# Patient Record
Sex: Male | Born: 1955 | Race: White | Hispanic: No | Marital: Single | Smoking: Former smoker
Health system: Southern US, Community
[De-identification: ages and names within clinical notes are randomized; demographics above are authoritative.]

## PROBLEM LIST (undated history)

## (undated) DIAGNOSIS — I10 Essential (primary) hypertension: Secondary | ICD-10-CM

## (undated) DIAGNOSIS — F32A Depression, unspecified: Secondary | ICD-10-CM

## (undated) DIAGNOSIS — F609 Personality disorder, unspecified: Secondary | ICD-10-CM

## (undated) DIAGNOSIS — F329 Major depressive disorder, single episode, unspecified: Secondary | ICD-10-CM

## (undated) DIAGNOSIS — W3400XA Accidental discharge from unspecified firearms or gun, initial encounter: Secondary | ICD-10-CM

## (undated) DIAGNOSIS — E669 Obesity, unspecified: Secondary | ICD-10-CM

## (undated) DIAGNOSIS — S0193XA Puncture wound without foreign body of unspecified part of head, initial encounter: Secondary | ICD-10-CM

## (undated) DIAGNOSIS — E7800 Pure hypercholesterolemia, unspecified: Secondary | ICD-10-CM

## (undated) DIAGNOSIS — R413 Other amnesia: Secondary | ICD-10-CM

## (undated) HISTORY — PX: OTHER SURGICAL HISTORY: SHX169

## (undated) HISTORY — DX: Depression, unspecified: F32.A

## (undated) HISTORY — DX: Puncture wound without foreign body of unspecified part of head, initial encounter: S01.93XA

## (undated) HISTORY — DX: Major depressive disorder, single episode, unspecified: F32.9

## (undated) HISTORY — PX: COLONOSCOPY W/ BIOPSIES AND POLYPECTOMY: SHX1376

## (undated) HISTORY — DX: Other amnesia: R41.3

## (undated) HISTORY — DX: Obesity, unspecified: E66.9

## (undated) HISTORY — DX: Personality disorder, unspecified: F60.9

## (undated) HISTORY — DX: Accidental discharge from unspecified firearms or gun, initial encounter: W34.00XA

## (undated) HISTORY — DX: Pure hypercholesterolemia, unspecified: E78.00

## (undated) HISTORY — DX: Essential (primary) hypertension: I10

## (undated) SURGERY — Surgical Case
Anesthesia: *Unknown

---

## 1998-04-13 ENCOUNTER — Ambulatory Visit (HOSPITAL_COMMUNITY): Admission: RE | Admit: 1998-04-13 | Discharge: 1998-04-13 | Payer: Self-pay | Admitting: *Deleted

## 2004-08-12 ENCOUNTER — Ambulatory Visit: Payer: Self-pay | Admitting: Internal Medicine

## 2004-10-29 ENCOUNTER — Ambulatory Visit: Payer: Self-pay | Admitting: Internal Medicine

## 2004-11-11 ENCOUNTER — Ambulatory Visit: Payer: Self-pay | Admitting: Internal Medicine

## 2004-12-27 ENCOUNTER — Ambulatory Visit: Payer: Self-pay | Admitting: Internal Medicine

## 2005-02-11 ENCOUNTER — Ambulatory Visit: Payer: Self-pay | Admitting: Internal Medicine

## 2005-03-18 ENCOUNTER — Ambulatory Visit: Payer: Self-pay | Admitting: Internal Medicine

## 2005-04-21 ENCOUNTER — Ambulatory Visit: Payer: Self-pay | Admitting: Internal Medicine

## 2005-07-22 ENCOUNTER — Ambulatory Visit: Payer: Self-pay | Admitting: Internal Medicine

## 2005-11-21 ENCOUNTER — Ambulatory Visit: Payer: Self-pay | Admitting: Internal Medicine

## 2006-02-20 ENCOUNTER — Ambulatory Visit: Payer: Self-pay | Admitting: Internal Medicine

## 2006-06-16 ENCOUNTER — Ambulatory Visit: Payer: Self-pay | Admitting: Internal Medicine

## 2006-09-15 ENCOUNTER — Ambulatory Visit: Payer: Self-pay | Admitting: Internal Medicine

## 2006-10-19 ENCOUNTER — Ambulatory Visit: Payer: Self-pay | Admitting: Internal Medicine

## 2006-12-15 ENCOUNTER — Ambulatory Visit: Payer: Self-pay | Admitting: Internal Medicine

## 2007-03-15 ENCOUNTER — Encounter: Payer: Self-pay | Admitting: Internal Medicine

## 2007-03-15 DIAGNOSIS — M545 Low back pain, unspecified: Secondary | ICD-10-CM | POA: Insufficient documentation

## 2007-03-15 DIAGNOSIS — R569 Unspecified convulsions: Secondary | ICD-10-CM | POA: Insufficient documentation

## 2007-03-15 DIAGNOSIS — I10 Essential (primary) hypertension: Secondary | ICD-10-CM | POA: Insufficient documentation

## 2007-03-15 DIAGNOSIS — E785 Hyperlipidemia, unspecified: Secondary | ICD-10-CM | POA: Insufficient documentation

## 2007-08-23 ENCOUNTER — Encounter: Payer: Self-pay | Admitting: Internal Medicine

## 2007-09-13 ENCOUNTER — Encounter: Admission: RE | Admit: 2007-09-13 | Discharge: 2007-09-13 | Payer: Self-pay | Admitting: Family Medicine

## 2008-02-28 ENCOUNTER — Emergency Department (HOSPITAL_COMMUNITY): Admission: EM | Admit: 2008-02-28 | Discharge: 2008-02-28 | Payer: Self-pay | Admitting: Emergency Medicine

## 2009-07-30 ENCOUNTER — Encounter: Admission: RE | Admit: 2009-07-30 | Discharge: 2009-07-30 | Payer: Self-pay | Admitting: Neurology

## 2010-09-29 ENCOUNTER — Encounter: Payer: Self-pay | Admitting: Neurology

## 2011-01-24 NOTE — Op Note (Signed)
[name], [name] NO.:  [id number]   MEDICAL RECORD NO.:  [id number]          PATIENT TYPE:  EMS   LOCATION:  MAJO                         FACILITY:  [facility]   PHYSICIAN:  Quita Skye. Hart Rochester, M.D.  DATE OF BIRTH:  11-10-1955   DATE OF PROCEDURE:  12/30/2007  DATE OF DISCHARGE:                               OPERATIVE REPORT   PREOPERATIVE DIAGNOSIS:  End-stage renal disease.   POSTOPERATIVE DIAGNOSIS:  End-stage renal disease.   OPERATION:  1. Bilateral ultrasound localization internal jugular veins.  2. Insertion of Diatek catheter via left internal jugular vein (28      cm).   SURGEON:  Quita Skye. Hart Rochester, MD.   FIRST ASSISTANT:  Nurse.   ANESTHESIA:  Local.   PROCEDURE:  The patient was taken to the operating room, placed in the  Trendelenburg position.  Both internal jugular veins were imaged using B-  mode ultrasound.  The right internal jugular vein was larger than the  left.  A pacemaker was in place on the right infraclavicular area.  On  the left side, internal jugular vein appeared normal in appearance.  After prepping and draping in routine sterile manner, one attempt was  made to enter the right internal jugular vein, and artery was entered.  Compression applied for 10 minutes with no hematoma.  We then went to  the left side, left internal jugular vein was entered using a  supraclavicular approach.  Guidewire passed into the right atrium under  fluoroscopic guidance.  After dilating the tract appropriately over an  Amplatz super stiff wire, a 28-cm Diatek catheter was passed through  peel-away sheath, positioned in the right atrium, tunneled peripherally,  and secured with nylon sutures.  Wound was closed with Vicryl in  subcuticular fashion.  Sterile dressing applied.  The patient taken to  the recovery room in satisfactory condition.      Quita Skye Hart Rochester, M.D.  Electronically Signed     JDL/MEDQ  D:  12/30/2007  T:  12/31/2007  Job:   [id number]

## 2011-06-05 LAB — URINALYSIS, ROUTINE W REFLEX MICROSCOPIC
Glucose, UA: NEGATIVE
Hgb urine dipstick: NEGATIVE
Ketones, ur: NEGATIVE
pH: 6

## 2011-06-05 LAB — DIFFERENTIAL
Basophils Absolute: 0.1
Lymphocytes Relative: 37
Lymphs Abs: 3.6
Monocytes Absolute: 0.8
Monocytes Relative: 8
Neutro Abs: 5

## 2011-06-05 LAB — COMPREHENSIVE METABOLIC PANEL
Albumin: 3.9
BUN: 12
Calcium: 9.9
Creatinine, Ser: 0.9
GFR calc Af Amer: >60
Total Bilirubin: 1.2
Total Protein: 6.9

## 2011-06-05 LAB — CBC
HCT: 49.6
MCHC: 34.5
MCV: 89.5
Platelets: 192
RDW: 12.9

## 2011-06-05 LAB — RAPID URINE DRUG SCREEN, HOSP PERFORMED
Amphetamines: NOT DETECTED
Benzodiazepines: NOT DETECTED
Opiates: NOT DETECTED
Tetrahydrocannabinol: NOT DETECTED

## 2011-07-03 ENCOUNTER — Other Ambulatory Visit: Payer: Self-pay | Admitting: Gastroenterology

## 2011-07-03 ENCOUNTER — Ambulatory Visit (HOSPITAL_COMMUNITY)
Admission: RE | Admit: 2011-07-03 | Discharge: 2011-07-03 | Disposition: A | Discharge status: Home / Self Care | Payer: Medicare Other | Source: Ambulatory Visit | Attending: Gastroenterology | Admitting: Gastroenterology

## 2011-07-03 DIAGNOSIS — F341 Dysthymic disorder: Secondary | ICD-10-CM | POA: Insufficient documentation

## 2011-07-03 DIAGNOSIS — D126 Benign neoplasm of colon, unspecified: Secondary | ICD-10-CM | POA: Insufficient documentation

## 2011-07-03 DIAGNOSIS — I10 Essential (primary) hypertension: Secondary | ICD-10-CM | POA: Insufficient documentation

## 2011-07-03 DIAGNOSIS — K648 Other hemorrhoids: Secondary | ICD-10-CM | POA: Insufficient documentation

## 2011-07-03 DIAGNOSIS — E785 Hyperlipidemia, unspecified: Secondary | ICD-10-CM | POA: Insufficient documentation

## 2011-07-03 DIAGNOSIS — G40909 Epilepsy, unspecified, not intractable, without status epilepticus: Secondary | ICD-10-CM | POA: Insufficient documentation

## 2011-07-03 DIAGNOSIS — Z79899 Other long term (current) drug therapy: Secondary | ICD-10-CM | POA: Insufficient documentation

## 2011-07-03 DIAGNOSIS — Z1211 Encounter for screening for malignant neoplasm of colon: Secondary | ICD-10-CM | POA: Insufficient documentation

## 2011-07-10 NOTE — Op Note (Signed)
  NAMEAMADOU, [name] [name] NO.:  [id number]  MEDICAL RECORD NO.:  [id number]  LOCATION:  WLEN                         FACILITY:  [facility]  PHYSICIAN:  Shirley Friar, MDDATE OF BIRTH:  18-May-1956  DATE OF PROCEDURE:  07/03/2011 DATE OF DISCHARGE:                              OPERATIVE REPORT   PROCEDURE:  Colonoscopy.  INDICATION:  Screening.  MEDICATIONS FOR ANESTHESIA: 1. Propofol 130 mg IV. 2. Fentanyl 100 mcg IV. 3. Versed 1 mg IV. 4. Narcan 0.08 mg IV.  FINDINGS:  Rectal exam was unremarkable.  PROCEDURE IN DETAIL: A pediatric colonoscope was inserted into a well-prepped colon and advanced to the cecum.  Ileocecal valve and appendiceal orifice were identified.  On careful withdrawal of the colonoscope, there was a 1 cm pedunculated polyp in the sigmoid colon that was removed with snare cautery.  Near this area was a 6-mm sessile polyp that was removed with snare cautery.  On further withdrawal of the colonoscope, no other mucosal abnormalities were seen.  Retroflexion revealed small internal hemorrhoids.  ASSESSMENT: 1. Colon polyps x2, status post removal by snare cautery x2. 2. Small internal hemorrhoids.  PLAN: 1. No aspirin products x14 days. 2. Follow up on path.     Shirley Friar, MD     VCS/MEDQ  D:  07/03/2011  T:  07/03/2011  Job:  [id number]  Electronically Signed by Charlott Rakes MD on 07/10/2011 10:48:20 AM

## 2012-12-30 ENCOUNTER — Telehealth: Payer: Self-pay | Admitting: *Deleted

## 2012-12-30 NOTE — Telephone Encounter (Signed)
Patient has been sched with group home([facility] [phone])

## 2013-04-07 ENCOUNTER — Telehealth: Payer: Self-pay | Admitting: Neurology

## 2013-05-24 ENCOUNTER — Encounter: Payer: Self-pay | Admitting: Neurology

## 2013-05-25 ENCOUNTER — Ambulatory Visit: Payer: Self-pay | Admitting: Neurology

## 2013-06-10 ENCOUNTER — Ambulatory Visit: Payer: Self-pay | Admitting: Neurology

## 2013-12-08 NOTE — Telephone Encounter (Signed)
Closing encounter

## 2014-01-17 ENCOUNTER — Ambulatory Visit: Payer: Medicare Other | Admitting: Diagnostic Neuroimaging

## 2014-01-23 ENCOUNTER — Ambulatory Visit (INDEPENDENT_AMBULATORY_CARE_PROVIDER_SITE_OTHER): Payer: Medicare Other | Admitting: Diagnostic Neuroimaging

## 2014-01-23 ENCOUNTER — Encounter (INDEPENDENT_AMBULATORY_CARE_PROVIDER_SITE_OTHER): Payer: Self-pay

## 2014-01-23 ENCOUNTER — Ambulatory Visit: Payer: Medicare Other | Admitting: Diagnostic Neuroimaging

## 2014-01-23 ENCOUNTER — Encounter: Payer: Self-pay | Admitting: Diagnostic Neuroimaging

## 2014-01-23 VITALS — BP 139/84 | HR 63 | Temp 98.0°F | Ht 66.0 in | Wt 197.0 lb

## 2014-01-23 DIAGNOSIS — S0190XA Unspecified open wound of unspecified part of head, initial encounter: Secondary | ICD-10-CM

## 2014-01-23 DIAGNOSIS — S069X9A Unspecified intracranial injury with loss of consciousness of unspecified duration, initial encounter: Principal | ICD-10-CM

## 2014-01-23 DIAGNOSIS — W3400XA Accidental discharge from unspecified firearms or gun, initial encounter: Secondary | ICD-10-CM

## 2014-01-23 DIAGNOSIS — T148XXA Other injury of unspecified body region, initial encounter: Secondary | ICD-10-CM
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
## 2014-01-23 NOTE — Patient Instructions (Signed)
Continue current medications. 

## 2014-11-16 ENCOUNTER — Encounter: Payer: Self-pay | Admitting: Diagnostic Neuroimaging

## 2014-11-16 ENCOUNTER — Ambulatory Visit (INDEPENDENT_AMBULATORY_CARE_PROVIDER_SITE_OTHER): Payer: Medicare Other | Admitting: Diagnostic Neuroimaging

## 2014-11-16 VITALS — BP 139/89 | HR 72 | Ht 68.0 in | Wt 206.4 lb

## 2014-11-16 DIAGNOSIS — S069X5S Unspecified intracranial injury with loss of consciousness greater than 24 hours with return to pre-existing conscious level, sequela: Secondary | ICD-10-CM | POA: Diagnosis not present

## 2014-11-16 DIAGNOSIS — G4720 Circadian rhythm sleep disorder, unspecified type: Secondary | ICD-10-CM | POA: Diagnosis not present

## 2014-11-16 DIAGNOSIS — S0190XS Unspecified open wound of unspecified part of head, sequela: Secondary | ICD-10-CM

## 2014-11-16 DIAGNOSIS — F518 Other sleep disorders not due to a substance or known physiological condition: Secondary | ICD-10-CM
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
## 2014-11-16 NOTE — Patient Instructions (Signed)
Give visual cues to patient / reminders about wake up time. Clock, sign etc.

## 2015-09-25 ENCOUNTER — Encounter (HOSPITAL_COMMUNITY): Payer: Self-pay | Admitting: *Deleted

## 2015-09-25 ENCOUNTER — Other Ambulatory Visit: Payer: Self-pay | Admitting: Gastroenterology

## 2015-09-25 NOTE — Addendum Note (Signed)
Addended by: Wilford Corner on: 09/25/2015 06:03 PM   Modules accepted: Orders

## 2015-09-25 NOTE — Progress Notes (Signed)
Pt currently resides in a group home and is under the care of Mrs. Janell Quiet. Mrs. Rico Junker completed pt SDW-pre-op call. Both Mrs. Rico Junker, caregiver and pt sister and guardian, Inocencio Homes were unable to complete pt anesthesia assessment; they both agreed that pt had a previous colonoscopy without any complications. Caregiver made aware to stop administering  Aspirin, otc vitamins, fish oil and herbal medications. Do not take any NSAIDs ie: Ibuprofen, Advil, Naproxen, Mobic, BC and Goody powder or any medication containing Aspirin. Caregiver denies that pt C/O any acute cardiopulmonary issues. Caregiver denies that pt is under the care of a cardiologist. Caregiver denies that pt had a stress test, echo and cardiac cath. Caregiver denies pt had a chest x ray or EKG within the last year. Mrs. Rico Junker verbalized understanding of all pre-op instructions, including bowel prep.

## 2015-09-26 ENCOUNTER — Ambulatory Visit (HOSPITAL_COMMUNITY)
Admission: RE | Admit: 2015-09-26 | Discharge: 2015-09-26 | Disposition: A | Discharge status: Home / Self Care | Payer: Medicare Other | Source: Ambulatory Visit | Attending: Gastroenterology | Admitting: Gastroenterology

## 2015-09-26 ENCOUNTER — Encounter (HOSPITAL_COMMUNITY)
Admission: RE | Disposition: A | Discharge status: Home / Self Care | Payer: Self-pay | Source: Ambulatory Visit | Attending: Gastroenterology

## 2015-09-26 ENCOUNTER — Ambulatory Visit (HOSPITAL_COMMUNITY): Payer: Medicare Other | Admitting: Certified Registered Nurse Anesthetist

## 2015-09-26 ENCOUNTER — Encounter (HOSPITAL_COMMUNITY): Payer: Self-pay | Admitting: Certified Registered Nurse Anesthetist

## 2015-09-26 DIAGNOSIS — D125 Benign neoplasm of sigmoid colon: Secondary | ICD-10-CM | POA: Diagnosis not present

## 2015-09-26 DIAGNOSIS — K648 Other hemorrhoids: Secondary | ICD-10-CM | POA: Diagnosis not present

## 2015-09-26 DIAGNOSIS — I10 Essential (primary) hypertension: Secondary | ICD-10-CM | POA: Insufficient documentation

## 2015-09-26 DIAGNOSIS — D123 Benign neoplasm of transverse colon: Secondary | ICD-10-CM | POA: Insufficient documentation

## 2015-09-26 DIAGNOSIS — K5730 Diverticulosis of large intestine without perforation or abscess without bleeding: Secondary | ICD-10-CM | POA: Insufficient documentation

## 2015-09-26 DIAGNOSIS — Z860100 Personal history of colon polyps, unspecified: Secondary | ICD-10-CM

## 2015-09-26 DIAGNOSIS — Z87891 Personal history of nicotine dependence: Secondary | ICD-10-CM | POA: Diagnosis not present

## 2015-09-26 HISTORY — PX: COLONOSCOPY WITH PROPOFOL: SHX5780

## 2015-09-26 SURGERY — COLONOSCOPY WITH PROPOFOL
Anesthesia: Monitor Anesthesia Care

## 2015-09-26 MED ORDER — SODIUM CHLORIDE 0.9 % IV SOLN: INTRAVENOUS | Status: DC

## 2015-09-26 MED ORDER — PROPOFOL 500 MG/50ML IV EMUL
INTRAVENOUS | Status: DC | PRN
  Administered 2015-09-26: 75 ug/kg/min via INTRAVENOUS

## 2015-09-26 MED ORDER — LACTATED RINGERS IV SOLN: INTRAVENOUS | Status: DC

## 2015-09-26 MED ORDER — LACTATED RINGERS IV SOLN
INTRAVENOUS | Status: DC | PRN
  Administered 2015-09-26: 09:00:00 via INTRAVENOUS

## 2015-09-26 MED ORDER — LIDOCAINE HCL (CARDIAC) 20 MG/ML IV SOLN
INTRAVENOUS | Status: DC | PRN
  Administered 2015-09-26: 80 mg via INTRAVENOUS

## 2015-09-26 NOTE — Op Note (Signed)
[facility] [place], [id number]   COLONOSCOPY PROCEDURE REPORT     EXAM DATE: 10/07/15  PATIENT NAME:      [name] [name]           MR #:      [id number]  BIRTHDATE:       09-22-1955      VISIT #:     [id number]  ATTENDING:     Wilford Corner, MD     STATUS:     outpatient REFERRING MD:      Lujean Amel, M.D. ASA CLASS:        Class II  INDICATIONS:  The patient is a 60 yr old male here for a colonoscopy due to high risk patient with personal history of colonic polyps.  PROCEDURE PERFORMED:     Colonoscopy with snare polypectomy MEDICATIONS:     Monitored anesthesia care and Per Anesthesia  ESTIMATED BLOOD LOSS:     None  CONSENT: The patient understands the risks and benefits of the procedure and understands that these risks include, but are not limited to: sedation, allergic reaction, infection, perforation and/or bleeding. Alternative means of evaluation and treatment include, among others: physical exam, x-rays, and/or surgical intervention. The patient elects to proceed with this endoscopic procedure.  DESCRIPTION OF PROCEDURE: During intra-op preparation period all mechanical & medical equipment was checked for proper function. Hand hygiene and appropriate measures for infection prevention was taken. After the risks, benefits and alternatives of the procedure were thoroughly explained, Informed consent was verified, confirmed and timeout was successfully executed by the treatment team. A digital exam revealed no abnormalities of the rectum.      The Pentax Ped Colon [id number] endoscope was introduced through the anus and advanced to the cecum, which was identified by both the appendix and ileocecal valve. The prep was good.. The instrument was then slowly withdrawn as the colon was fully examined. Estimated blood loss is zero unless otherwise noted in this procedure report. Loop reduction and abdominal pressure  was necessary to reach the cecum. A 8 mm sessile ascending colon polyp was removed with snare cautery. A 6 mm semi-pedunculated polyp was removed from the sigmoid colon with snare cautery. A 3 mm semi-sessile polyp was removed from the sigmoid colon with cold biopsy. A few small sigmoid diverticuli noted.      Retroflexed views revealed small internal hemorrhoids.  The scope was then completely withdrawn from the patient and the procedure terminated.     ADVERSE EVENTS:      There were no immediate complications.   IMPRESSIONS:     Colon polyps X 3 - see above Sigmoid diverticulosis Internal hemorrhoids  RECOMMENDATIONS:     F/U on path; Avoid aspirin products for 2 weeks; High fiber diet   Wilford Corner, MD eSigned:  Wilford Corner, MD 07-Oct-2015 10:02 AM   cc:  CPT CODES: ICD CODES:  The ICD and CPT codes recommended by this software are interpretations from the data that the clinical staff has captured with the software.  The verification of the translation of this report to the ICD and CPT codes and modifiers is the sole responsibility of the health care institution and practicing physician where this report was generated.  [employer] will not be held responsible for the validity of the ICD and CPT codes included on this report.  AMA assumes no liability for data contained or not contained herein. CPT is a registered  trademark of the [employer].  PATIENT NAME:  [name], [name] MR#: [id number]

## 2015-09-26 NOTE — Interval H&P Note (Signed)
History and Physical Interval Note:  09/26/2015 9:00 AM  [name] [name]  has presented today for surgery, with the diagnosis of history colon polyps  The various methods of treatment have been discussed with the patient and family. After consideration of risks, benefits and other options for treatment, the patient has consented to  Procedure(s): COLONOSCOPY WITH PROPOFOL (N/A) as a surgical intervention .  The patient's history has been reviewed, patient examined, no change in status, stable for surgery.  I have reviewed the patient's chart and labs.  Questions were answered to the patient's satisfaction.     [place]

## 2015-09-26 NOTE — H&P (Signed)
  Date of Initial H&P: 09/20/15  History reviewed, patient examined, no change in status, stable for surgery.

## 2015-09-26 NOTE — Anesthesia Postprocedure Evaluation (Signed)
Anesthesia Post Note  Patient: [name] [name]  Procedure(s) Performed: Procedure(s) (LRB): COLONOSCOPY WITH PROPOFOL (N/A)  Patient location during evaluation: Endoscopy Anesthesia Type: MAC Level of consciousness: awake and awake and alert Pain management: pain level controlled Vital Signs Assessment: post-procedure vital signs reviewed and stable Respiratory status: spontaneous breathing and nonlabored ventilation Anesthetic complications: no    Last Vitals:  Filed Vitals:   09/26/15 1025 09/26/15 1030  BP: 114/72   Pulse: 50 51  Temp:    Resp: 13 11    Last Pain: There were no vitals filed for this visit.               Vici Novick COKER

## 2015-09-26 NOTE — Progress Notes (Signed)
Chest rash looks better. Becoming less red. Rash does not appear anywhere else.

## 2015-09-26 NOTE — Transfer of Care (Signed)
Immediate Anesthesia Transfer of Care Note  Patient: [name] [name]  Procedure(s) Performed: Procedure(s): COLONOSCOPY WITH PROPOFOL (N/A)  Patient Location: Endoscopy Unit  Anesthesia Type:MAC  Level of Consciousness: awake, alert  and oriented  Airway & Oxygen Therapy: Patient Spontanous Breathing and Patient connected to nasal cannula oxygen  Post-op Assessment: Report given to RN and Post -op Vital signs reviewed and stable  Post vital signs: Reviewed and stable  Last Vitals:  Filed Vitals:   09/26/15 0824  BP: 135/76  Pulse: 54  Temp: 36.6 C  Resp: 13    Complications: No apparent anesthesia complications

## 2015-09-26 NOTE — Discharge Instructions (Signed)

## 2015-09-26 NOTE — Anesthesia Preprocedure Evaluation (Addendum)
Anesthesia Evaluation  Patient identified by MRN, date of birth, ID band Patient awake    Reviewed: Allergy & Precautions, NPO status , Patient's Chart, lab work & pertinent test results, Unable to perform ROS - Chart review only  Airway Mallampati: II  TM Distance: >3 FB Neck ROM: Full    Dental  (+) Dental Advisory Given, Teeth Intact   Pulmonary former smoker,    breath sounds clear to auscultation       Cardiovascular hypertension,  Rhythm:Regular Rate:Normal     Neuro/Psych    GI/Hepatic   Endo/Other    Renal/GU      Musculoskeletal   Abdominal   Peds  Hematology   Anesthesia Other Findings   Reproductive/Obstetrics                           Anesthesia Physical Anesthesia Plan  ASA: II  Anesthesia Plan: MAC   Post-op Pain Management:    Induction: Intravenous  Airway Management Planned: Natural Airway and Simple Face Mask  Additional Equipment:   Intra-op Plan:   Post-operative Plan:   Informed Consent: I have reviewed the patients History and Physical, chart, labs and discussed the procedure including the risks, benefits and alternatives for the proposed anesthesia with the patient or authorized representative who has indicated his/her understanding and acceptance.     Plan Discussed with: CRNA and Anesthesiologist  Anesthesia Plan Comments:         Anesthesia Quick Evaluation

## 2015-09-26 NOTE — Progress Notes (Signed)
Patient with reddened rash on chest area. Not seen on back legs arms or face. Patient denies symptoms or itching. Dr. Linna Caprice made aware and in to see patient. No new orders at this time. Will continue to monitor.

## 2015-09-27 ENCOUNTER — Encounter (HOSPITAL_COMMUNITY): Payer: Self-pay | Admitting: Gastroenterology

## 2015-11-15 DIAGNOSIS — Z79899 Other long term (current) drug therapy: Secondary | ICD-10-CM | POA: Diagnosis not present

## 2015-12-19 DIAGNOSIS — Z79899 Other long term (current) drug therapy: Secondary | ICD-10-CM | POA: Diagnosis not present

## 2015-12-19 DIAGNOSIS — J309 Allergic rhinitis, unspecified: Secondary | ICD-10-CM | POA: Diagnosis not present

## 2015-12-19 DIAGNOSIS — M545 Low back pain: Secondary | ICD-10-CM | POA: Diagnosis not present

## 2015-12-19 DIAGNOSIS — E782 Mixed hyperlipidemia: Secondary | ICD-10-CM | POA: Diagnosis not present

## 2015-12-19 DIAGNOSIS — G8929 Other chronic pain: Secondary | ICD-10-CM | POA: Diagnosis not present

## 2015-12-19 DIAGNOSIS — I10 Essential (primary) hypertension: Secondary | ICD-10-CM | POA: Diagnosis not present

## 2016-02-06 DIAGNOSIS — Z79899 Other long term (current) drug therapy: Secondary | ICD-10-CM | POA: Diagnosis not present

## 2016-02-18 DIAGNOSIS — Z111 Encounter for screening for respiratory tuberculosis: Secondary | ICD-10-CM | POA: Diagnosis not present

## 2016-02-25 DIAGNOSIS — J309 Allergic rhinitis, unspecified: Secondary | ICD-10-CM | POA: Diagnosis not present

## 2016-02-25 DIAGNOSIS — M25561 Pain in right knee: Secondary | ICD-10-CM | POA: Diagnosis not present

## 2016-02-26 ENCOUNTER — Other Ambulatory Visit: Payer: Self-pay | Admitting: Family Medicine

## 2016-02-26 ENCOUNTER — Ambulatory Visit
Admission: RE | Admit: 2016-02-26 | Discharge: 2016-02-26 | Disposition: A | Discharge status: Home / Self Care | Payer: Medicare Other | Source: Ambulatory Visit | Attending: Family Medicine | Admitting: Family Medicine

## 2016-02-26 DIAGNOSIS — M25561 Pain in right knee: Secondary | ICD-10-CM

## 2016-03-10 DIAGNOSIS — H04123 Dry eye syndrome of bilateral lacrimal glands: Secondary | ICD-10-CM | POA: Diagnosis not present

## 2016-03-10 DIAGNOSIS — H2513 Age-related nuclear cataract, bilateral: Secondary | ICD-10-CM | POA: Diagnosis not present

## 2016-03-18 DIAGNOSIS — G8929 Other chronic pain: Secondary | ICD-10-CM | POA: Diagnosis not present

## 2016-03-18 DIAGNOSIS — M25561 Pain in right knee: Secondary | ICD-10-CM | POA: Diagnosis not present

## 2016-03-18 DIAGNOSIS — M2241 Chondromalacia patellae, right knee: Secondary | ICD-10-CM | POA: Diagnosis not present

## 2016-04-28 DIAGNOSIS — Z79899 Other long term (current) drug therapy: Secondary | ICD-10-CM | POA: Diagnosis not present

## 2016-05-22 DIAGNOSIS — Z23 Encounter for immunization: Secondary | ICD-10-CM | POA: Diagnosis not present

## 2016-06-03 DIAGNOSIS — F3489 Other specified persistent mood disorders: Secondary | ICD-10-CM | POA: Diagnosis not present

## 2016-06-03 DIAGNOSIS — F6381 Intermittent explosive disorder: Secondary | ICD-10-CM | POA: Diagnosis not present

## 2016-06-03 DIAGNOSIS — F319 Bipolar disorder, unspecified: Secondary | ICD-10-CM | POA: Diagnosis not present

## 2016-06-24 DIAGNOSIS — I10 Essential (primary) hypertension: Secondary | ICD-10-CM | POA: Diagnosis not present

## 2016-06-24 DIAGNOSIS — J309 Allergic rhinitis, unspecified: Secondary | ICD-10-CM | POA: Diagnosis not present

## 2016-06-24 DIAGNOSIS — R7309 Other abnormal glucose: Secondary | ICD-10-CM | POA: Diagnosis not present

## 2016-06-24 DIAGNOSIS — Z0001 Encounter for general adult medical examination with abnormal findings: Secondary | ICD-10-CM | POA: Diagnosis not present

## 2016-06-24 DIAGNOSIS — F341 Dysthymic disorder: Secondary | ICD-10-CM | POA: Diagnosis not present

## 2016-06-24 DIAGNOSIS — Z125 Encounter for screening for malignant neoplasm of prostate: Secondary | ICD-10-CM | POA: Diagnosis not present

## 2016-06-24 DIAGNOSIS — Z79899 Other long term (current) drug therapy: Secondary | ICD-10-CM | POA: Diagnosis not present

## 2016-06-24 DIAGNOSIS — E782 Mixed hyperlipidemia: Secondary | ICD-10-CM | POA: Diagnosis not present

## 2016-06-24 DIAGNOSIS — R7301 Impaired fasting glucose: Secondary | ICD-10-CM | POA: Diagnosis not present

## 2016-07-09 DIAGNOSIS — N3941 Urge incontinence: Secondary | ICD-10-CM | POA: Diagnosis not present

## 2016-07-09 DIAGNOSIS — N3944 Nocturnal enuresis: Secondary | ICD-10-CM | POA: Diagnosis not present

## 2016-07-09 DIAGNOSIS — R350 Frequency of micturition: Secondary | ICD-10-CM | POA: Diagnosis not present

## 2016-08-13 DIAGNOSIS — N3941 Urge incontinence: Secondary | ICD-10-CM | POA: Diagnosis not present

## 2016-08-26 DIAGNOSIS — F6381 Intermittent explosive disorder: Secondary | ICD-10-CM | POA: Diagnosis not present

## 2016-08-26 DIAGNOSIS — F3489 Other specified persistent mood disorders: Secondary | ICD-10-CM | POA: Diagnosis not present

## 2016-08-26 DIAGNOSIS — F319 Bipolar disorder, unspecified: Secondary | ICD-10-CM | POA: Diagnosis not present

## 2016-10-01 DIAGNOSIS — F6381 Intermittent explosive disorder: Secondary | ICD-10-CM | POA: Diagnosis not present

## 2016-10-01 DIAGNOSIS — F319 Bipolar disorder, unspecified: Secondary | ICD-10-CM | POA: Diagnosis not present

## 2016-10-01 DIAGNOSIS — F3489 Other specified persistent mood disorders: Secondary | ICD-10-CM | POA: Diagnosis not present

## 2016-10-20 DIAGNOSIS — N3941 Urge incontinence: Secondary | ICD-10-CM | POA: Diagnosis not present

## 2016-10-20 DIAGNOSIS — N401 Enlarged prostate with lower urinary tract symptoms: Secondary | ICD-10-CM | POA: Diagnosis not present

## 2016-12-02 DIAGNOSIS — F6381 Intermittent explosive disorder: Secondary | ICD-10-CM | POA: Diagnosis not present

## 2016-12-02 DIAGNOSIS — F319 Bipolar disorder, unspecified: Secondary | ICD-10-CM | POA: Diagnosis not present

## 2016-12-02 DIAGNOSIS — F3489 Other specified persistent mood disorders: Secondary | ICD-10-CM | POA: Diagnosis not present

## 2016-12-10 DIAGNOSIS — Z79899 Other long term (current) drug therapy: Secondary | ICD-10-CM | POA: Diagnosis not present

## 2016-12-25 DIAGNOSIS — I10 Essential (primary) hypertension: Secondary | ICD-10-CM | POA: Diagnosis not present

## 2016-12-25 DIAGNOSIS — E782 Mixed hyperlipidemia: Secondary | ICD-10-CM | POA: Diagnosis not present

## 2016-12-25 DIAGNOSIS — F17210 Nicotine dependence, cigarettes, uncomplicated: Secondary | ICD-10-CM | POA: Diagnosis not present

## 2016-12-25 DIAGNOSIS — F341 Dysthymic disorder: Secondary | ICD-10-CM | POA: Diagnosis not present

## 2016-12-30 DIAGNOSIS — F319 Bipolar disorder, unspecified: Secondary | ICD-10-CM | POA: Diagnosis not present

## 2016-12-30 DIAGNOSIS — F6381 Intermittent explosive disorder: Secondary | ICD-10-CM | POA: Diagnosis not present

## 2017-01-07 ENCOUNTER — Other Ambulatory Visit: Payer: Self-pay | Admitting: Acute Care

## 2017-01-07 DIAGNOSIS — F17210 Nicotine dependence, cigarettes, uncomplicated: Secondary | ICD-10-CM

## 2017-01-09 DIAGNOSIS — I10 Essential (primary) hypertension: Secondary | ICD-10-CM | POA: Diagnosis not present

## 2017-01-19 ENCOUNTER — Encounter: Payer: Self-pay | Admitting: Acute Care

## 2017-01-19 ENCOUNTER — Ambulatory Visit (INDEPENDENT_AMBULATORY_CARE_PROVIDER_SITE_OTHER)
Admission: RE | Admit: 2017-01-19 | Discharge: 2017-01-19 | Disposition: A | Discharge status: Home / Self Care | Payer: Medicare Other | Source: Ambulatory Visit | Attending: Acute Care | Admitting: Acute Care

## 2017-01-19 ENCOUNTER — Ambulatory Visit (INDEPENDENT_AMBULATORY_CARE_PROVIDER_SITE_OTHER): Payer: Medicare Other | Admitting: Acute Care

## 2017-01-19 DIAGNOSIS — Z79899 Other long term (current) drug therapy: Secondary | ICD-10-CM | POA: Diagnosis not present

## 2017-01-19 DIAGNOSIS — F17210 Nicotine dependence, cigarettes, uncomplicated: Secondary | ICD-10-CM

## 2017-01-19 DIAGNOSIS — Z87891 Personal history of nicotine dependence: Secondary | ICD-10-CM

## 2017-01-19 DIAGNOSIS — L309 Dermatitis, unspecified: Secondary | ICD-10-CM | POA: Diagnosis not present

## 2017-01-19 DIAGNOSIS — Z122 Encounter for screening for malignant neoplasm of respiratory organs: Secondary | ICD-10-CM | POA: Diagnosis not present

## 2017-01-19 DIAGNOSIS — I10 Essential (primary) hypertension: Secondary | ICD-10-CM | POA: Diagnosis not present

## 2017-01-19 NOTE — Progress Notes (Signed)
Shared Decision Making Visit Lung Cancer Screening Program [phone])   Eligibility:  Age 61 y.o.  Pack Years Smoking History Calculation 46-pack-year smoking history (# packs/per year x # years smoked)  Recent History of coughing up blood  no  Unexplained weight loss? no ( >Than 15 pounds within the last 6 months )  Prior History Lung / other cancer no (Diagnosis within the last 5 years already requiring surveillance chest CT Scans).  Smoking Status Current Smoker  Former Smokers: Years since quit: Not applicable  Quit Date: Not applicable  Visit Components:  Discussion included one or more decision making aids. yes  Discussion included risk/benefits of screening. yes  Discussion included potential follow up diagnostic testing for abnormal scans. yes  Discussion included meaning and risk of over diagnosis. yes  Discussion included meaning and risk of False Positives. yes  Discussion included meaning of total radiation exposure. yes  Counseling Included:  Importance of adherence to annual lung cancer LDCT screening. yes  Impact of comorbidities on ability to participate in the program. yes  Ability and willingness to under diagnostic treatment. yes  Smoking Cessation Counseling:  Current Smokers:   Discussed importance of smoking cessation. yes  Information about tobacco cessation classes and interventions provided to patient. yes  Patient provided with "ticket" for LDCT Scan. yes  Symptomatic Patient. no  Counseling  Diagnosis Code: Tobacco Use Z72.0  Asymptomatic Patient yes  Counseling (Intermediate counseling: > three minutes counseling) Y1017  Former Smokers:   Discussed the importance of maintaining cigarette abstinence. yes  Diagnosis Code: Personal History of Nicotine Dependence. P10.258  Information about tobacco cessation classes and interventions provided to patient. Yes  Patient provided with "ticket" for LDCT Scan. yes  Written Order  for Lung Cancer Screening with LDCT placed in Epic. Yes (CT Chest Lung Cancer Screening Low Dose W/O CM) [id number] Z12.2-Screening of respiratory organs Z87.891-Personal history of nicotine dependence  I have spent 25 minutes of face to face time with Mr. Shirah and Rivka Barbara discussing the risks and benefits of lung cancer screening. We viewed a power point together that explained in detail the above noted topics. We paused at intervals to allow for questions to be asked and answered to ensure understanding.We discussed that the single most powerful action that he can take to decrease his risk of developing lung cancer is to quit smoking. We discussed whether or not he is ready to commit to setting a quit date. He is currently not ready to set a quit date. We discussed options for tools to aid in quitting smoking including nicotine replacement therapy, non-nicotine medications, support groups, Quit Smart classes, and behavior modification. We discussed that often times setting smaller, more achievable goals, such as eliminating 1 cigarette a day for a week and then 2 cigarettes a day for a week can be helpful in slowly decreasing the number of cigarettes smoked. This allows for a sense of accomplishment as well as providing a clinical benefit. I gave Mr. Richman the " Be Stronger Than Your Excuses" card with contact information for community resources, classes, free nicotine replacement therapy, and access to mobile apps, text messaging, and on-line smoking cessation help. I have also given him my card and contact information in the event he needs to contact me. We discussed the time and location of the scan, and that either Doroteo Glassman RN or I will call with the results within 24-48 hours of receiving them. I have offered Mr. Majano  a copy of the  power point we viewed  as a resource in the event they need reinforcement of the concepts we discussed today in the office. The patient verbalized understanding  of all of  the above and had no further questions upon leaving the office. They have my contact information in the event they have any further questions.  I spent 4 minutes counseling on smoking cessation and the health risks of continued tobacco abuse.  I explained to the patient that there has been a high incidence of coronary artery disease noted on these exams. I explained that this is a non-gated exam therefore degree or severity cannot be determined. This patient is currently on statin therapy. I have asked the patient to follow-up with their PCP regarding any incidental finding of coronary artery disease and management with diet or medication as their PCP  feels is clinically indicated. The patient verbalized understanding of the above and had no further questions upon completion of the visit.      Magdalen Spatz, NP

## 2017-01-26 ENCOUNTER — Other Ambulatory Visit: Payer: Self-pay | Admitting: Acute Care

## 2017-01-26 DIAGNOSIS — F17210 Nicotine dependence, cigarettes, uncomplicated: Secondary | ICD-10-CM

## 2017-02-17 DIAGNOSIS — R350 Frequency of micturition: Secondary | ICD-10-CM | POA: Diagnosis not present

## 2017-02-17 DIAGNOSIS — N401 Enlarged prostate with lower urinary tract symptoms: Secondary | ICD-10-CM | POA: Diagnosis not present

## 2017-02-17 DIAGNOSIS — N3944 Nocturnal enuresis: Secondary | ICD-10-CM | POA: Diagnosis not present

## 2017-03-18 DIAGNOSIS — F331 Major depressive disorder, recurrent, moderate: Secondary | ICD-10-CM | POA: Diagnosis not present

## 2017-03-30 DIAGNOSIS — L300 Nummular dermatitis: Secondary | ICD-10-CM | POA: Diagnosis not present

## 2017-03-31 DIAGNOSIS — Z111 Encounter for screening for respiratory tuberculosis: Secondary | ICD-10-CM | POA: Diagnosis not present

## 2017-04-01 DIAGNOSIS — F6381 Intermittent explosive disorder: Secondary | ICD-10-CM | POA: Diagnosis not present

## 2017-04-01 DIAGNOSIS — F319 Bipolar disorder, unspecified: Secondary | ICD-10-CM | POA: Diagnosis not present

## 2017-04-08 DIAGNOSIS — N401 Enlarged prostate with lower urinary tract symptoms: Secondary | ICD-10-CM | POA: Diagnosis not present

## 2017-04-08 DIAGNOSIS — N3944 Nocturnal enuresis: Secondary | ICD-10-CM | POA: Diagnosis not present

## 2017-04-08 DIAGNOSIS — N281 Cyst of kidney, acquired: Secondary | ICD-10-CM | POA: Diagnosis not present

## 2017-04-09 DIAGNOSIS — H2513 Age-related nuclear cataract, bilateral: Secondary | ICD-10-CM | POA: Diagnosis not present

## 2017-04-09 DIAGNOSIS — H04123 Dry eye syndrome of bilateral lacrimal glands: Secondary | ICD-10-CM | POA: Diagnosis not present

## 2017-04-14 DIAGNOSIS — F331 Major depressive disorder, recurrent, moderate: Secondary | ICD-10-CM | POA: Diagnosis not present

## 2017-05-12 DIAGNOSIS — F6381 Intermittent explosive disorder: Secondary | ICD-10-CM | POA: Diagnosis not present

## 2017-05-12 DIAGNOSIS — F319 Bipolar disorder, unspecified: Secondary | ICD-10-CM | POA: Diagnosis not present

## 2017-05-13 DIAGNOSIS — F331 Major depressive disorder, recurrent, moderate: Secondary | ICD-10-CM | POA: Diagnosis not present

## 2017-05-20 DIAGNOSIS — F331 Major depressive disorder, recurrent, moderate: Secondary | ICD-10-CM | POA: Diagnosis not present

## 2017-05-26 DIAGNOSIS — L300 Nummular dermatitis: Secondary | ICD-10-CM | POA: Diagnosis not present

## 2017-05-27 DIAGNOSIS — F331 Major depressive disorder, recurrent, moderate: Secondary | ICD-10-CM | POA: Diagnosis not present

## 2017-06-05 DIAGNOSIS — F331 Major depressive disorder, recurrent, moderate: Secondary | ICD-10-CM | POA: Diagnosis not present

## 2017-06-09 DIAGNOSIS — J0100 Acute maxillary sinusitis, unspecified: Secondary | ICD-10-CM | POA: Diagnosis not present

## 2017-06-17 DIAGNOSIS — F331 Major depressive disorder, recurrent, moderate: Secondary | ICD-10-CM | POA: Diagnosis not present

## 2017-06-17 DIAGNOSIS — F6381 Intermittent explosive disorder: Secondary | ICD-10-CM | POA: Diagnosis not present

## 2017-06-17 DIAGNOSIS — F319 Bipolar disorder, unspecified: Secondary | ICD-10-CM | POA: Diagnosis not present

## 2017-07-10 DIAGNOSIS — E782 Mixed hyperlipidemia: Secondary | ICD-10-CM | POA: Diagnosis not present

## 2017-07-10 DIAGNOSIS — F341 Dysthymic disorder: Secondary | ICD-10-CM | POA: Diagnosis not present

## 2017-07-10 DIAGNOSIS — Z23 Encounter for immunization: Secondary | ICD-10-CM | POA: Diagnosis not present

## 2017-07-10 DIAGNOSIS — Z0001 Encounter for general adult medical examination with abnormal findings: Secondary | ICD-10-CM | POA: Diagnosis not present

## 2017-08-04 DIAGNOSIS — F331 Major depressive disorder, recurrent, moderate: Secondary | ICD-10-CM | POA: Diagnosis not present

## 2017-08-05 DIAGNOSIS — L300 Nummular dermatitis: Secondary | ICD-10-CM | POA: Diagnosis not present

## 2017-08-13 ENCOUNTER — Encounter: Payer: Self-pay | Admitting: Podiatry

## 2017-08-13 ENCOUNTER — Ambulatory Visit (INDEPENDENT_AMBULATORY_CARE_PROVIDER_SITE_OTHER): Payer: Medicare Other | Admitting: Podiatry

## 2017-08-13 ENCOUNTER — Ambulatory Visit (INDEPENDENT_AMBULATORY_CARE_PROVIDER_SITE_OTHER): Payer: Medicare Other

## 2017-08-13 ENCOUNTER — Other Ambulatory Visit: Payer: Self-pay | Admitting: Podiatry

## 2017-08-13 VITALS — BP 139/83 | HR 58 | Resp 16

## 2017-08-13 DIAGNOSIS — M722 Plantar fascial fibromatosis: Secondary | ICD-10-CM

## 2017-08-13 DIAGNOSIS — M25572 Pain in left ankle and joints of left foot: Secondary | ICD-10-CM | POA: Diagnosis not present

## 2017-08-13 DIAGNOSIS — M779 Enthesopathy, unspecified: Secondary | ICD-10-CM

## 2017-08-13 DIAGNOSIS — M25571 Pain in right ankle and joints of right foot: Secondary | ICD-10-CM

## 2017-08-13 MED ORDER — TRIAMCINOLONE ACETONIDE 10 MG/ML IJ SUSP
10.0000 mg | Freq: Once | INTRAMUSCULAR | Status: AC
  Administered 2017-08-13: 10 mg

## 2017-08-13 NOTE — Progress Notes (Signed)
Subjective:   Patient ID: [name] [name], male   DOB: 61 y.o.   MRN: [id number]   HPI Patient presents with caregiver with pain around the left arch that is inflamed with fluid buildup and on the outside of the right foot.  Patient has been active on his feet and has had to wear a slip resistant shoe which is not typical and patient does smoke not at the current time but did smoke for a number years and likes to be active.   Review of Systems  All other systems reviewed and are negative.       Objective:  Physical Exam  Constitutional: He appears well-developed and well-nourished.  Cardiovascular: Intact distal pulses.  Pulmonary/Chest: Effort normal.  Musculoskeletal: Normal range of motion.  Neurological: He is alert.  Skin: Skin is warm.  Nursing note and vitals reviewed.   Neurovascular status intact muscle strength adequate range of motion within normal limits with patient found to have mid arch exquisite discomfort left and pain in the peroneal tendon insertion base of fifth metatarsal right which may be compensatory in nature.  Patient has good digital perfusion and is well oriented x3.     Assessment:  Plantar fasciitis left mid arch and peroneal tendinitis right with inflammation fluid buildup.     Plan:  H&P x-rays reviewed with patient and careful mid arch injection administered left 3 mg Kenalog 5 mg Xylocaine and peroneal insertion injection right 3 mg Kenalog 5 mg Xylocaine along with fascial brace application bilateral.  Discussed long-term orthotics which he wants and we will make them at next visit when I see that his symptoms have improved.  Patient will be seen back and also will utilize low-grade physical therapy.  X-rays did indicate moderate depression of the arch with small spur formation and no indications of stress fracture or advanced arthritis

## 2017-08-13 NOTE — Progress Notes (Signed)
   Subjective:    Patient ID: [name] [name], male    DOB: [birth year], 61 y.o.   MRN: [id number]  HPI    Review of Systems  All other systems reviewed and are negative.      Objective:   Physical Exam        Assessment & Plan:

## 2017-08-13 NOTE — Patient Instructions (Signed)

## 2017-08-14 ENCOUNTER — Ambulatory Visit: Payer: Medicare Other | Admitting: Podiatry

## 2017-08-20 ENCOUNTER — Ambulatory Visit (INDEPENDENT_AMBULATORY_CARE_PROVIDER_SITE_OTHER): Payer: Medicare Other | Admitting: Podiatry

## 2017-08-20 ENCOUNTER — Encounter: Payer: Self-pay | Admitting: Podiatry

## 2017-08-20 ENCOUNTER — Telehealth: Payer: Self-pay | Admitting: Podiatry

## 2017-08-20 DIAGNOSIS — M722 Plantar fascial fibromatosis: Secondary | ICD-10-CM

## 2017-08-20 NOTE — Telephone Encounter (Signed)
Called pt about orthotics not being covered by insurance([employer]). Pt was given price of 300.00 since not covered. Pt and male were on the phone and male said pt has to talk to sister and will let us know.

## 2017-08-20 NOTE — Progress Notes (Signed)
Subjective:   Patient ID: [name] [name], male   DOB: 61 y.o.   MRN: [id number]   HPI Patient states improved with pain still noted upon deep palpation and states long-term needs some kind of device to help support his arch.   ROS      Objective:  Physical Exam  Neurovascular status intact with patient's heels and arch left still discomforting and outside her right foot but improvement from previous visit.     Assessment:  Fasciitis improved but present     Plan:  Advised on orthotics and scan for customized orthotics to lift the arch up.  Patient also will continue with anti-inflammatory and utilizing braces and will be seen back when orthotics are ready

## 2017-09-03 NOTE — Telephone Encounter (Signed)
Lilia Pro from pts nursing facility called back and said pt is going to go ahead and proceed with the orthotics and is aware of the 300.000 cost. I have r/s the appt to pick up/

## 2017-09-09 DIAGNOSIS — N3941 Urge incontinence: Secondary | ICD-10-CM | POA: Diagnosis not present

## 2017-09-09 DIAGNOSIS — N401 Enlarged prostate with lower urinary tract symptoms: Secondary | ICD-10-CM | POA: Diagnosis not present

## 2017-09-09 DIAGNOSIS — N3944 Nocturnal enuresis: Secondary | ICD-10-CM | POA: Diagnosis not present

## 2017-09-10 DIAGNOSIS — F331 Major depressive disorder, recurrent, moderate: Secondary | ICD-10-CM | POA: Diagnosis not present

## 2017-09-15 ENCOUNTER — Encounter: Payer: Medicare Other | Admitting: Orthotics

## 2017-09-18 DIAGNOSIS — I10 Essential (primary) hypertension: Secondary | ICD-10-CM | POA: Diagnosis not present

## 2017-09-18 DIAGNOSIS — R5383 Other fatigue: Secondary | ICD-10-CM | POA: Diagnosis not present

## 2017-09-29 ENCOUNTER — Encounter: Payer: Medicare Other | Admitting: Orthotics

## 2017-10-06 ENCOUNTER — Ambulatory Visit: Payer: Medicare Other | Admitting: Orthotics

## 2017-10-06 DIAGNOSIS — M79676 Pain in unspecified toe(s): Secondary | ICD-10-CM

## 2017-10-06 DIAGNOSIS — M722 Plantar fascial fibromatosis: Secondary | ICD-10-CM

## 2017-10-06 NOTE — Progress Notes (Signed)
Patient came in today to pick up custom made foot orthotics.  The goals were accomplished and the patient reported no dissatisfaction with said orthotics.  Patient was advised of breakin period and how to report any issues. 

## 2017-10-13 DIAGNOSIS — F331 Major depressive disorder, recurrent, moderate: Secondary | ICD-10-CM | POA: Diagnosis not present

## 2017-10-27 DIAGNOSIS — F1210 Cannabis abuse, uncomplicated: Secondary | ICD-10-CM | POA: Diagnosis not present

## 2017-10-27 DIAGNOSIS — F319 Bipolar disorder, unspecified: Secondary | ICD-10-CM | POA: Diagnosis not present

## 2017-10-27 DIAGNOSIS — F6381 Intermittent explosive disorder: Secondary | ICD-10-CM | POA: Diagnosis not present

## 2017-11-10 DIAGNOSIS — M25511 Pain in right shoulder: Secondary | ICD-10-CM | POA: Diagnosis not present

## 2017-11-12 DIAGNOSIS — F331 Major depressive disorder, recurrent, moderate: Secondary | ICD-10-CM | POA: Diagnosis not present

## 2017-12-23 ENCOUNTER — Encounter: Payer: Self-pay | Admitting: Podiatry

## 2017-12-23 ENCOUNTER — Ambulatory Visit (INDEPENDENT_AMBULATORY_CARE_PROVIDER_SITE_OTHER): Payer: Medicare Other | Admitting: Podiatry

## 2017-12-23 DIAGNOSIS — M722 Plantar fascial fibromatosis: Secondary | ICD-10-CM

## 2017-12-23 DIAGNOSIS — M779 Enthesopathy, unspecified: Secondary | ICD-10-CM

## 2017-12-23 DIAGNOSIS — M7750 Other enthesopathy of unspecified foot: Secondary | ICD-10-CM

## 2017-12-23 MED ORDER — TRIAMCINOLONE ACETONIDE 10 MG/ML IJ SUSP
10.0000 mg | Freq: Once | INTRAMUSCULAR | Status: AC
  Administered 2017-12-23: 10 mg

## 2017-12-23 NOTE — Progress Notes (Signed)
Subjective:   Patient ID: [name] [name], male   DOB: 62 y.o.   MRN: [id number]   HPI Patient presents stating he is still having pain on the outside of his right foot and his heels while improved and at times bother him   ROS      Objective:  Physical Exam  Neurovascular status intact with inflammation pain to the lateral side right foot at the peroneal insertion with inflammation fluid buildup and discomfort in the plantar heels mild to moderate in intensity     Assessment:  2 separate problems one being tendinitis of the right peroneal insertion and secondarily plantar fasciitis     Plan:  Reviewed plantar fasciitis and the importance of orthotics stretching exercises and support shoe and careful injection administered to the outside of the right peroneal insertion 3 mg dexamethasone Kenalog 5 mg Xylocaine and advised on ice therapy

## 2017-12-29 DIAGNOSIS — F3489 Other specified persistent mood disorders: Secondary | ICD-10-CM | POA: Diagnosis not present

## 2017-12-29 DIAGNOSIS — F319 Bipolar disorder, unspecified: Secondary | ICD-10-CM | POA: Diagnosis not present

## 2017-12-29 DIAGNOSIS — F6381 Intermittent explosive disorder: Secondary | ICD-10-CM | POA: Diagnosis not present

## 2018-01-11 DIAGNOSIS — F341 Dysthymic disorder: Secondary | ICD-10-CM | POA: Diagnosis not present

## 2018-01-11 DIAGNOSIS — I10 Essential (primary) hypertension: Secondary | ICD-10-CM | POA: Diagnosis not present

## 2018-01-11 DIAGNOSIS — E782 Mixed hyperlipidemia: Secondary | ICD-10-CM | POA: Diagnosis not present

## 2018-01-11 DIAGNOSIS — J309 Allergic rhinitis, unspecified: Secondary | ICD-10-CM | POA: Diagnosis not present

## 2018-01-25 ENCOUNTER — Ambulatory Visit (INDEPENDENT_AMBULATORY_CARE_PROVIDER_SITE_OTHER)
Admission: RE | Admit: 2018-01-25 | Discharge: 2018-01-25 | Disposition: A | Discharge status: Home / Self Care | Payer: Medicare Other | Source: Ambulatory Visit | Attending: Acute Care | Admitting: Acute Care

## 2018-01-25 DIAGNOSIS — Z87891 Personal history of nicotine dependence: Secondary | ICD-10-CM | POA: Diagnosis not present

## 2018-01-25 DIAGNOSIS — F17210 Nicotine dependence, cigarettes, uncomplicated: Secondary | ICD-10-CM

## 2018-02-02 ENCOUNTER — Other Ambulatory Visit: Payer: Self-pay | Admitting: Acute Care

## 2018-02-02 DIAGNOSIS — F17210 Nicotine dependence, cigarettes, uncomplicated: Secondary | ICD-10-CM

## 2018-02-02 DIAGNOSIS — Z122 Encounter for screening for malignant neoplasm of respiratory organs: Secondary | ICD-10-CM

## 2018-02-02 DIAGNOSIS — F331 Major depressive disorder, recurrent, moderate: Secondary | ICD-10-CM | POA: Diagnosis not present

## 2018-02-24 DIAGNOSIS — F0280 Dementia in other diseases classified elsewhere without behavioral disturbance: Secondary | ICD-10-CM | POA: Diagnosis not present

## 2018-03-10 DIAGNOSIS — F331 Major depressive disorder, recurrent, moderate: Secondary | ICD-10-CM | POA: Diagnosis not present

## 2018-05-03 ENCOUNTER — Encounter: Payer: Self-pay | Admitting: Podiatry

## 2018-05-03 ENCOUNTER — Ambulatory Visit (INDEPENDENT_AMBULATORY_CARE_PROVIDER_SITE_OTHER): Payer: Medicare Other | Admitting: Podiatry

## 2018-05-03 DIAGNOSIS — M779 Enthesopathy, unspecified: Secondary | ICD-10-CM

## 2018-05-03 DIAGNOSIS — M7751 Other enthesopathy of right foot: Secondary | ICD-10-CM | POA: Diagnosis not present

## 2018-05-03 MED ORDER — TRIAMCINOLONE ACETONIDE 10 MG/ML IJ SUSP
10.0000 mg | Freq: Once | INTRAMUSCULAR | Status: AC
  Administered 2018-05-03: 10 mg

## 2018-05-04 NOTE — Progress Notes (Signed)
Subjective:   Patient ID: [name] [name], male   DOB: 62 y.o.   MRN: [id number]   HPI Patient states my heel has started to bother me again and is hurting when I walk   ROS      Objective:  Physical Exam  Inflammatory fasciitis right plantar heel     Assessment:  Reviewed condition and discussed plantar fasciitis tendinitis     Plan:  Reinjected the plantar fascial 3 Milgram Kenalog 5 mg Xylocaine advised on stretching exercises anti-inflammatories and also looked at the peroneal tendon doing a small injection on this area.  Reappoint as needed

## 2018-05-07 DIAGNOSIS — F331 Major depressive disorder, recurrent, moderate: Secondary | ICD-10-CM | POA: Diagnosis not present

## 2018-06-10 DIAGNOSIS — F331 Major depressive disorder, recurrent, moderate: Secondary | ICD-10-CM | POA: Diagnosis not present

## 2018-06-15 DIAGNOSIS — Z79899 Other long term (current) drug therapy: Secondary | ICD-10-CM | POA: Diagnosis not present

## 2018-07-19 DIAGNOSIS — Z79899 Other long term (current) drug therapy: Secondary | ICD-10-CM | POA: Diagnosis not present

## 2018-07-20 DIAGNOSIS — Z111 Encounter for screening for respiratory tuberculosis: Secondary | ICD-10-CM | POA: Diagnosis not present

## 2018-07-20 DIAGNOSIS — E782 Mixed hyperlipidemia: Secondary | ICD-10-CM | POA: Diagnosis not present

## 2018-07-20 DIAGNOSIS — J309 Allergic rhinitis, unspecified: Secondary | ICD-10-CM | POA: Diagnosis not present

## 2018-07-20 DIAGNOSIS — I10 Essential (primary) hypertension: Secondary | ICD-10-CM | POA: Diagnosis not present

## 2018-07-22 DIAGNOSIS — Z79899 Other long term (current) drug therapy: Secondary | ICD-10-CM | POA: Diagnosis not present

## 2018-07-22 DIAGNOSIS — R7301 Impaired fasting glucose: Secondary | ICD-10-CM | POA: Diagnosis not present

## 2018-07-22 DIAGNOSIS — E782 Mixed hyperlipidemia: Secondary | ICD-10-CM | POA: Diagnosis not present

## 2018-09-21 DIAGNOSIS — Z79899 Other long term (current) drug therapy: Secondary | ICD-10-CM | POA: Diagnosis not present

## 2018-10-06 DIAGNOSIS — R351 Nocturia: Secondary | ICD-10-CM | POA: Diagnosis not present

## 2018-10-06 DIAGNOSIS — R350 Frequency of micturition: Secondary | ICD-10-CM | POA: Diagnosis not present

## 2018-11-01 ENCOUNTER — Ambulatory Visit: Payer: Medicare Other | Admitting: Podiatry

## 2018-11-24 ENCOUNTER — Ambulatory Visit: Payer: Medicare Other | Admitting: Podiatry

## 2019-01-19 ENCOUNTER — Ambulatory Visit: Payer: Medicare Other | Admitting: Podiatry

## 2019-03-01 ENCOUNTER — Other Ambulatory Visit: Payer: Self-pay | Admitting: *Deleted

## 2019-03-01 DIAGNOSIS — Z87891 Personal history of nicotine dependence: Secondary | ICD-10-CM

## 2019-03-01 DIAGNOSIS — Z122 Encounter for screening for malignant neoplasm of respiratory organs: Secondary | ICD-10-CM

## 2019-03-01 DIAGNOSIS — F17210 Nicotine dependence, cigarettes, uncomplicated: Secondary | ICD-10-CM

## 2019-03-23 ENCOUNTER — Ambulatory Visit: Payer: Medicare Other | Admitting: Podiatry

## 2019-04-14 ENCOUNTER — Inpatient Hospital Stay: Admission: RE | Admit: 2019-04-14 | Payer: Medicare Other | Source: Ambulatory Visit

## 2019-05-09 ENCOUNTER — Ambulatory Visit
Admission: RE | Admit: 2019-05-09 | Discharge: 2019-05-09 | Disposition: A | Discharge status: Home / Self Care | Payer: Medicare Other | Source: Ambulatory Visit | Attending: Acute Care | Admitting: Acute Care

## 2019-05-09 ENCOUNTER — Other Ambulatory Visit: Payer: Self-pay

## 2019-05-09 DIAGNOSIS — Z122 Encounter for screening for malignant neoplasm of respiratory organs: Secondary | ICD-10-CM

## 2019-05-09 DIAGNOSIS — F17210 Nicotine dependence, cigarettes, uncomplicated: Secondary | ICD-10-CM

## 2019-05-09 DIAGNOSIS — Z87891 Personal history of nicotine dependence: Secondary | ICD-10-CM

## 2019-05-30 DIAGNOSIS — R7301 Impaired fasting glucose: Secondary | ICD-10-CM | POA: Diagnosis not present

## 2019-05-30 DIAGNOSIS — Z79899 Other long term (current) drug therapy: Secondary | ICD-10-CM | POA: Diagnosis not present

## 2019-05-30 DIAGNOSIS — E782 Mixed hyperlipidemia: Secondary | ICD-10-CM | POA: Diagnosis not present

## 2019-05-30 DIAGNOSIS — Z125 Encounter for screening for malignant neoplasm of prostate: Secondary | ICD-10-CM | POA: Diagnosis not present

## 2019-06-01 DIAGNOSIS — I10 Essential (primary) hypertension: Secondary | ICD-10-CM | POA: Diagnosis not present

## 2019-06-01 DIAGNOSIS — I700 Atherosclerosis of aorta: Secondary | ICD-10-CM | POA: Diagnosis not present

## 2019-06-01 DIAGNOSIS — E782 Mixed hyperlipidemia: Secondary | ICD-10-CM | POA: Diagnosis not present

## 2019-06-01 DIAGNOSIS — Z0001 Encounter for general adult medical examination with abnormal findings: Secondary | ICD-10-CM | POA: Diagnosis not present

## 2019-10-31 ENCOUNTER — Ambulatory Visit: Payer: Medicare Other | Attending: Family

## 2019-10-31 ENCOUNTER — Other Ambulatory Visit: Payer: Self-pay

## 2019-10-31 DIAGNOSIS — Z23 Encounter for immunization: Secondary | ICD-10-CM

## 2019-10-31 NOTE — Progress Notes (Signed)
   Covid-19 Vaccination Clinic  Name:  [name] [name]    MRN: [id number] DOB: Mar 08, 1956  10/31/2019  [name] was observed post Covid-19 immunization for 15 minutes without incidence. He was provided with Vaccine Information Sheet and instruction to access the V-Safe system.   [name] was instructed to call 911 with any severe reactions post vaccine: Marland Kitchen Difficulty breathing  . Swelling of your face and throat  . A fast heartbeat  . A bad rash all over your body  . Dizziness and weakness    Immunizations Administered    Name Date Dose VIS Date Route   Moderna COVID-19 Vaccine 10/31/2019  9:50 AM 0.5 mL 08/09/2019 Intramuscular   Manufacturer: Moderna   Lot: [id number]   Merriam WoodsPO:[id number]

## 2019-11-04 ENCOUNTER — Encounter (HOSPITAL_COMMUNITY): Payer: Self-pay | Admitting: Emergency Medicine

## 2019-11-04 ENCOUNTER — Emergency Department (HOSPITAL_COMMUNITY)
Admission: EM | Admit: 2019-11-04 | Discharge: 2019-11-04 | Disposition: A | Discharge status: Nursing Home (Includes ICF) | Payer: Medicare Other | Attending: Emergency Medicine | Admitting: Emergency Medicine

## 2019-11-04 ENCOUNTER — Other Ambulatory Visit: Payer: Self-pay

## 2019-11-04 ENCOUNTER — Emergency Department (HOSPITAL_COMMUNITY): Payer: Medicare Other

## 2019-11-04 DIAGNOSIS — W1839XA Other fall on same level, initial encounter: Secondary | ICD-10-CM | POA: Diagnosis not present

## 2019-11-04 DIAGNOSIS — I10 Essential (primary) hypertension: Secondary | ICD-10-CM | POA: Diagnosis not present

## 2019-11-04 DIAGNOSIS — Z87820 Personal history of traumatic brain injury: Secondary | ICD-10-CM | POA: Insufficient documentation

## 2019-11-04 DIAGNOSIS — R55 Syncope and collapse: Secondary | ICD-10-CM | POA: Insufficient documentation

## 2019-11-04 DIAGNOSIS — G40909 Epilepsy, unspecified, not intractable, without status epilepticus: Secondary | ICD-10-CM | POA: Insufficient documentation

## 2019-11-04 DIAGNOSIS — Y92199 Unspecified place in other specified residential institution as the place of occurrence of the external cause: Secondary | ICD-10-CM | POA: Diagnosis not present

## 2019-11-04 DIAGNOSIS — Y9389 Activity, other specified: Secondary | ICD-10-CM | POA: Diagnosis not present

## 2019-11-04 DIAGNOSIS — Y999 Unspecified external cause status: Secondary | ICD-10-CM | POA: Diagnosis not present

## 2019-11-04 DIAGNOSIS — Z79899 Other long term (current) drug therapy: Secondary | ICD-10-CM | POA: Diagnosis not present

## 2019-11-04 DIAGNOSIS — S0990XA Unspecified injury of head, initial encounter: Secondary | ICD-10-CM | POA: Diagnosis not present

## 2019-11-04 DIAGNOSIS — S0001XA Abrasion of scalp, initial encounter: Secondary | ICD-10-CM | POA: Diagnosis not present

## 2019-11-04 LAB — URINALYSIS, ROUTINE W REFLEX MICROSCOPIC
Bacteria, UA: NONE SEEN
Bilirubin Urine: NEGATIVE
Glucose, UA: NEGATIVE mg/dL
Hgb urine dipstick: NEGATIVE
Ketones, ur: NEGATIVE mg/dL
Leukocytes,Ua: NEGATIVE
Nitrite: NEGATIVE
Protein, ur: 100 mg/dL — AB
Specific Gravity, Urine: 1.015 (ref 1.005–1.030)
pH: 6 (ref 5.0–8.0)

## 2019-11-04 LAB — BASIC METABOLIC PANEL
Anion gap: 8 (ref 5–15)
BUN: 13 mg/dL (ref 8–23)
CO2: 27 mmol/L (ref 22–32)
Calcium: 9.2 mg/dL (ref 8.9–10.3)
Chloride: 100 mmol/L (ref 98–111)
Creatinine, Ser: 0.81 mg/dL (ref 0.61–1.24)
GFR calc Af Amer: >60 mL/min (ref >60)
GFR calc non Af Amer: >60 mL/min (ref >60)
Glucose, Bld: 118 mg/dL — ABNORMAL HIGH (ref 70–99)
Potassium: 3.6 mmol/L (ref 3.5–5.1)
Sodium: 135 mmol/L (ref 135–145)

## 2019-11-04 LAB — CBC WITH DIFFERENTIAL/PLATELET
Abs Immature Granulocytes: 0.08 10*3/uL — ABNORMAL HIGH (ref 0.00–0.07)
Basophils Absolute: 0 10*3/uL (ref 0.0–0.1)
Basophils Relative: 0 %
Eosinophils Absolute: 0.2 10*3/uL (ref 0.0–0.5)
Eosinophils Relative: 2 %
HCT: 44.6 % (ref 39.0–52.0)
Hemoglobin: 15.1 g/dL (ref 13.0–17.0)
Immature Granulocytes: 1 %
Lymphocytes Relative: 21 %
Lymphs Abs: 2.3 10*3/uL (ref 0.7–4.0)
MCH: 31.9 pg (ref 26.0–34.0)
MCHC: 33.9 g/dL (ref 30.0–36.0)
MCV: 94.1 fL (ref 80.0–100.0)
Monocytes Absolute: 1.1 10*3/uL — ABNORMAL HIGH (ref 0.1–1.0)
Monocytes Relative: 10 %
Neutro Abs: 7.2 10*3/uL (ref 1.7–7.7)
Neutrophils Relative %: 66 %
Platelets: 177 10*3/uL (ref 150–400)
RBC: 4.74 MIL/uL (ref 4.22–5.81)
RDW: 12.5 % (ref 11.5–15.5)
WBC: 10.9 10*3/uL — ABNORMAL HIGH (ref 4.0–10.5)
nRBC: 0 % (ref 0.0–0.2)

## 2019-11-04 LAB — CBG MONITORING, ED: Glucose-Capillary: 114 mg/dL — ABNORMAL HIGH (ref 70–99)
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
## 2019-11-04 NOTE — Discharge Instructions (Addendum)
You have been evaluated for your passing out episode.  No concerning finding were noted during this visit.  Please follow up with your doctor for further care.  Return if you have any concerns.

## 2019-11-04 NOTE — ED Triage Notes (Signed)
Patient BIB group home director, reports syncopal episode in the bathroom tonight. Caregiver reports AMS and confusion after fall. Patient reports hitting head on the floor. Denies taking blood thinner. Denies chest pain and SOB.

## 2019-11-10 DIAGNOSIS — H1045 Other chronic allergic conjunctivitis: Secondary | ICD-10-CM | POA: Diagnosis not present

## 2019-11-10 DIAGNOSIS — H2513 Age-related nuclear cataract, bilateral: Secondary | ICD-10-CM | POA: Diagnosis not present

## 2019-11-10 DIAGNOSIS — H04123 Dry eye syndrome of bilateral lacrimal glands: Secondary | ICD-10-CM | POA: Diagnosis not present

## 2019-11-29 ENCOUNTER — Ambulatory Visit: Payer: Medicare Other | Attending: Family

## 2019-11-29 DIAGNOSIS — Z23 Encounter for immunization: Secondary | ICD-10-CM

## 2019-11-29 DIAGNOSIS — Z79899 Other long term (current) drug therapy: Secondary | ICD-10-CM | POA: Diagnosis not present

## 2019-11-29 DIAGNOSIS — R55 Syncope and collapse: Secondary | ICD-10-CM | POA: Diagnosis not present

## 2019-11-29 DIAGNOSIS — E782 Mixed hyperlipidemia: Secondary | ICD-10-CM | POA: Diagnosis not present

## 2019-11-29 DIAGNOSIS — I10 Essential (primary) hypertension: Secondary | ICD-10-CM | POA: Diagnosis not present

## 2019-11-29 NOTE — Progress Notes (Signed)
   Covid-19 Vaccination Clinic  Name:  [name] [name]    MRN: [id number] DOB: [birth year]  11/29/2019  [name] [name] was observed post Covid-19 immunization for 15 minutes without incident. He was provided with Vaccine Information Sheet and instruction to access the V-Safe system.   [name] [name] was instructed to call 911 with any severe reactions post vaccine: Marland Kitchen Difficulty breathing  . Swelling of face and throat  . A fast heartbeat  . A bad rash all over body  . Dizziness and weakness   Immunizations Administered    Name Date Dose VIS Date Route   Moderna COVID-19 Vaccine 11/29/2019  4:18 PM 0.5 mL 08/09/2019 Intramuscular   Manufacturer: Moderna   LotMV:[id number]   LennoxBE:[id number]

## 2019-11-30 DIAGNOSIS — N401 Enlarged prostate with lower urinary tract symptoms: Secondary | ICD-10-CM | POA: Diagnosis not present

## 2019-11-30 DIAGNOSIS — R351 Nocturia: Secondary | ICD-10-CM | POA: Diagnosis not present

## 2019-11-30 DIAGNOSIS — R350 Frequency of micturition: Secondary | ICD-10-CM | POA: Diagnosis not present

## 2020-06-12 DIAGNOSIS — R7309 Other abnormal glucose: Secondary | ICD-10-CM | POA: Diagnosis not present

## 2020-06-12 DIAGNOSIS — Z0001 Encounter for general adult medical examination with abnormal findings: Secondary | ICD-10-CM | POA: Diagnosis not present

## 2020-06-12 DIAGNOSIS — E782 Mixed hyperlipidemia: Secondary | ICD-10-CM | POA: Diagnosis not present

## 2020-06-12 DIAGNOSIS — Z79899 Other long term (current) drug therapy: Secondary | ICD-10-CM | POA: Diagnosis not present

## 2020-06-13 DIAGNOSIS — Z0001 Encounter for general adult medical examination with abnormal findings: Secondary | ICD-10-CM | POA: Diagnosis not present

## 2020-06-13 DIAGNOSIS — I10 Essential (primary) hypertension: Secondary | ICD-10-CM | POA: Diagnosis not present

## 2020-06-13 DIAGNOSIS — F341 Dysthymic disorder: Secondary | ICD-10-CM | POA: Diagnosis not present

## 2020-06-13 DIAGNOSIS — I700 Atherosclerosis of aorta: Secondary | ICD-10-CM | POA: Diagnosis not present

## 2020-06-18 ENCOUNTER — Telehealth: Payer: Self-pay | Admitting: Acute Care

## 2020-06-18 DIAGNOSIS — F17210 Nicotine dependence, cigarettes, uncomplicated: Secondary | ICD-10-CM

## 2020-06-18 DIAGNOSIS — Z87891 Personal history of nicotine dependence: Secondary | ICD-10-CM

## 2020-06-19 NOTE — Telephone Encounter (Signed)
I'm not sure who Cher Nakai is but I LVM for her to return my call [phone]

## 2020-06-19 NOTE — Telephone Encounter (Signed)
Anita, can you contact pt to schedule f/u low dose ct?  

## 2020-06-19 NOTE — Telephone Encounter (Signed)
New Ct order placed

## 2020-06-19 NOTE — Telephone Encounter (Signed)
[name] [name] this patient was due 05/09/20 and his LCS CT order has expired. If you would put in a new CT order I will get him scheduled

## 2020-06-25 NOTE — Telephone Encounter (Signed)
[name] [name] calling regarding scheduling CT lung cancer screening.  [phone].

## 2020-06-25 NOTE — Telephone Encounter (Signed)
I have called 2 different times trying to speak with Janeane. I did speak with April and we were able to get Mr. Dobler's LCS CT scheduled for 07/10/2020 @ 10:00am at [place]. They will provide transportation for him

## 2020-07-10 ENCOUNTER — Ambulatory Visit: Payer: Medicare Other

## 2020-07-11 ENCOUNTER — Ambulatory Visit: Payer: Medicare Other

## 2020-07-17 ENCOUNTER — Ambulatory Visit
Admission: RE | Admit: 2020-07-17 | Discharge: 2020-07-17 | Disposition: A | Discharge status: Home / Self Care | Payer: Medicare Other | Source: Ambulatory Visit | Attending: Acute Care | Admitting: Acute Care

## 2020-07-17 ENCOUNTER — Other Ambulatory Visit: Payer: Self-pay

## 2020-07-17 DIAGNOSIS — I2510 Atherosclerotic heart disease of native coronary artery without angina pectoris: Secondary | ICD-10-CM | POA: Diagnosis not present

## 2020-07-17 DIAGNOSIS — J8410 Pulmonary fibrosis, unspecified: Secondary | ICD-10-CM | POA: Diagnosis not present

## 2020-07-17 DIAGNOSIS — F17210 Nicotine dependence, cigarettes, uncomplicated: Secondary | ICD-10-CM | POA: Diagnosis not present

## 2020-07-17 DIAGNOSIS — J432 Centrilobular emphysema: Secondary | ICD-10-CM | POA: Diagnosis not present

## 2020-07-17 DIAGNOSIS — Z87891 Personal history of nicotine dependence: Secondary | ICD-10-CM

## 2020-07-19 NOTE — Progress Notes (Signed)
I have attempted to call patient's sister Inocencio Homes . There was no answer. I have left a HIPPA compliant VM asking that she call the office for results, and that she speak with either Judson Roch NP or Doroteo Glassman RN.  Langley Gauss, please place order for 6 month follow up.  He has a care giver, but she is not authorized to get medical information, so make sure it is his sister who we are talking to. Thanks so much

## 2020-07-23 NOTE — Progress Notes (Signed)
I have left a HIPPA compliant message  on the patients sister's VM. I have asked that she return the call for the results of her brothers LDCT. The scan was read as a LR 3, so he will need a 6 month follow up. If she does not call within the week, lets send her a letter( she manages all of her brothers medical needs). Thanks Langley Gauss, please place the order for the 6 month follow up.

## 2020-08-06 ENCOUNTER — Telehealth: Payer: Self-pay | Admitting: Acute Care

## 2020-08-06 DIAGNOSIS — F17210 Nicotine dependence, cigarettes, uncomplicated: Secondary | ICD-10-CM

## 2020-08-06 DIAGNOSIS — Z87891 Personal history of nicotine dependence: Secondary | ICD-10-CM

## 2020-08-06 NOTE — Telephone Encounter (Signed)
Pt's sister. Inocencio Homes (DPR)  informed of CT results per Eric Form, NP.  She verbalized understanding.  Copy sent to PCP.  Order placed for 6 mth f/u CT.

## 2020-11-27 DIAGNOSIS — M25562 Pain in left knee: Secondary | ICD-10-CM | POA: Diagnosis not present

## 2020-12-17 DIAGNOSIS — Z79899 Other long term (current) drug therapy: Secondary | ICD-10-CM | POA: Diagnosis not present

## 2020-12-19 DIAGNOSIS — F341 Dysthymic disorder: Secondary | ICD-10-CM | POA: Diagnosis not present

## 2020-12-19 DIAGNOSIS — I10 Essential (primary) hypertension: Secondary | ICD-10-CM | POA: Diagnosis not present

## 2020-12-19 DIAGNOSIS — E782 Mixed hyperlipidemia: Secondary | ICD-10-CM | POA: Diagnosis not present

## 2020-12-19 DIAGNOSIS — I700 Atherosclerosis of aorta: Secondary | ICD-10-CM | POA: Diagnosis not present

## 2020-12-27 DIAGNOSIS — R350 Frequency of micturition: Secondary | ICD-10-CM | POA: Diagnosis not present

## 2020-12-27 DIAGNOSIS — R351 Nocturia: Secondary | ICD-10-CM | POA: Diagnosis not present

## 2021-01-08 DIAGNOSIS — M25562 Pain in left knee: Secondary | ICD-10-CM | POA: Diagnosis not present

## 2021-01-09 DIAGNOSIS — F319 Bipolar disorder, unspecified: Secondary | ICD-10-CM | POA: Diagnosis not present

## 2021-01-09 DIAGNOSIS — F6381 Intermittent explosive disorder: Secondary | ICD-10-CM | POA: Diagnosis not present

## 2021-01-09 DIAGNOSIS — F4310 Post-traumatic stress disorder, unspecified: Secondary | ICD-10-CM | POA: Diagnosis not present

## 2021-01-29 ENCOUNTER — Ambulatory Visit
Admission: RE | Admit: 2021-01-29 | Discharge: 2021-01-29 | Disposition: A | Discharge status: Home / Self Care | Payer: Medicare Other | Source: Ambulatory Visit | Attending: Acute Care | Admitting: Acute Care

## 2021-01-29 DIAGNOSIS — F17210 Nicotine dependence, cigarettes, uncomplicated: Secondary | ICD-10-CM

## 2021-01-29 DIAGNOSIS — I2510 Atherosclerotic heart disease of native coronary artery without angina pectoris: Secondary | ICD-10-CM | POA: Diagnosis not present

## 2021-01-29 DIAGNOSIS — Z87891 Personal history of nicotine dependence: Secondary | ICD-10-CM

## 2021-01-29 DIAGNOSIS — J432 Centrilobular emphysema: Secondary | ICD-10-CM | POA: Diagnosis not present

## 2021-01-29 DIAGNOSIS — J929 Pleural plaque without asbestos: Secondary | ICD-10-CM | POA: Diagnosis not present

## 2021-01-29 DIAGNOSIS — I708 Atherosclerosis of other arteries: Secondary | ICD-10-CM | POA: Diagnosis not present

## 2021-01-31 DIAGNOSIS — Z86010 Personal history of colonic polyps: Secondary | ICD-10-CM | POA: Diagnosis not present

## 2021-01-31 DIAGNOSIS — K5900 Constipation, unspecified: Secondary | ICD-10-CM | POA: Diagnosis not present

## 2021-02-07 DIAGNOSIS — F6381 Intermittent explosive disorder: Secondary | ICD-10-CM | POA: Diagnosis not present

## 2021-02-07 DIAGNOSIS — F319 Bipolar disorder, unspecified: Secondary | ICD-10-CM | POA: Diagnosis not present

## 2021-02-14 DIAGNOSIS — M1712 Unilateral primary osteoarthritis, left knee: Secondary | ICD-10-CM | POA: Diagnosis not present

## 2021-02-14 NOTE — Progress Notes (Signed)
Please call patient and let them  know their  low dose Ct was read as a Lung RADS 2: nodules that are benign in appearance and behavior with a very low likelihood of becoming a clinically active cancer due to size or lack of growth. Recommendation per radiology is for a repeat LDCT in 12 months. .Please let them  know we will order and schedule their  annual screening scan for 01/2022. Please let them  know there was notation of CAD on their  scan.  Please remind the patient  that this is a non-gated exam therefore degree or severity of disease  cannot be determined. Please have them  follow up with their PCP regarding potential risk factor modification, dietary therapy or pharmacologic therapy if clinically indicated. Pt.  is  currently on statin therapy. Please place order for annual  screening scan for  01/2022 and fax results to PCP. Thanks so much.  Pt has 2 vessel CAD. He is on statins.Make sure he is aware and can discuss with PCP. Thanks so much

## 2021-02-19 ENCOUNTER — Encounter: Payer: Self-pay | Admitting: *Deleted

## 2021-02-19 DIAGNOSIS — F17210 Nicotine dependence, cigarettes, uncomplicated: Secondary | ICD-10-CM

## 2021-02-19 DIAGNOSIS — Z87891 Personal history of nicotine dependence: Secondary | ICD-10-CM

## 2021-05-07 DIAGNOSIS — F6381 Intermittent explosive disorder: Secondary | ICD-10-CM | POA: Diagnosis not present

## 2021-05-07 DIAGNOSIS — F4310 Post-traumatic stress disorder, unspecified: Secondary | ICD-10-CM | POA: Diagnosis not present

## 2021-05-07 DIAGNOSIS — F319 Bipolar disorder, unspecified: Secondary | ICD-10-CM | POA: Diagnosis not present

## 2021-05-22 ENCOUNTER — Other Ambulatory Visit: Payer: Self-pay

## 2021-05-22 ENCOUNTER — Encounter: Payer: Self-pay | Admitting: Cardiovascular Disease

## 2021-05-22 ENCOUNTER — Ambulatory Visit (INDEPENDENT_AMBULATORY_CARE_PROVIDER_SITE_OTHER): Payer: Medicare Other | Admitting: Cardiovascular Disease

## 2021-05-22 VITALS — BP 142/82 | HR 63 | Ht 68.0 in | Wt 194.0 lb

## 2021-05-22 DIAGNOSIS — I700 Atherosclerosis of aorta: Secondary | ICD-10-CM

## 2021-05-22 DIAGNOSIS — I10 Essential (primary) hypertension: Secondary | ICD-10-CM | POA: Diagnosis not present

## 2021-05-22 DIAGNOSIS — I2510 Atherosclerotic heart disease of native coronary artery without angina pectoris: Secondary | ICD-10-CM

## 2021-05-22 DIAGNOSIS — F17200 Nicotine dependence, unspecified, uncomplicated: Secondary | ICD-10-CM | POA: Diagnosis not present

## 2021-05-22 DIAGNOSIS — E785 Hyperlipidemia, unspecified: Secondary | ICD-10-CM | POA: Diagnosis not present
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
## 2021-05-22 NOTE — Patient Instructions (Signed)
Medication Instructions:  No changes *If you need a refill on your cardiac medications before your next appointment, please call your pharmacy*   Lab Work: None ordered If you have labs (blood work) drawn today and your tests are completely normal, you will receive your results only by: [place] (if you have MyChart) OR A paper copy in the mail If you have any lab test that is abnormal or we need to change your treatment, we will call you to review the results.   Testing/Procedures: Your physician has requested that you have an exercise tolerance test. For further information please visit [email or web address]. Please also follow instruction sheet, as given. This will take place at [place], [address]. Do not drink or eat foods with caffeine for 24 hours before the test. (Chocolate, coffee, tea, or energy drinks) If you use an inhaler, bring it with you to the test. Do not smoke for 4 hours before the test. Wear comfortable shoes and clothing. Hold the propanolol the morning of the test    Follow-Up: At [facility], you and your health needs are our priority.  As part of our continuing mission to provide you with exceptional heart care, we have created designated Provider Care Teams.  These Care Teams include your primary Cardiologist (physician) and Advanced Practice Providers (APPs -  Physician Assistants and Nurse Practitioners) who all work together to provide you with the care you need, when you need it.  We recommend signing up for the patient portal called "MyChart".  Sign up information is provided on this After Visit Summary.  MyChart is used to connect with patients for Virtual Visits (Telemedicine).  Patients are able to view lab/test results, encounter notes, upcoming appointments, etc.  Non-urgent messages can be sent to your provider as well.   To learn more about what you can do with MyChart, go to [email or web address].    Your next appointment:    Follow up as needed with Dr. Sallyanne Kuster

## 2021-05-30 ENCOUNTER — Telehealth (HOSPITAL_COMMUNITY): Payer: Self-pay | Admitting: *Deleted

## 2021-05-30 NOTE — Telephone Encounter (Signed)
Close encounter 

## 2021-05-31 ENCOUNTER — Ambulatory Visit (HOSPITAL_COMMUNITY)
Admission: RE | Admit: 2021-05-31 | Discharge: 2021-05-31 | Disposition: A | Discharge status: Home / Self Care | Payer: Medicare Other | Source: Ambulatory Visit | Attending: Cardiovascular Disease | Admitting: Cardiovascular Disease

## 2021-05-31 ENCOUNTER — Other Ambulatory Visit: Payer: Self-pay

## 2021-05-31 DIAGNOSIS — I2510 Atherosclerotic heart disease of native coronary artery without angina pectoris: Secondary | ICD-10-CM | POA: Diagnosis not present

## 2021-05-31 LAB — EXERCISE TOLERANCE TEST
Angina Index: 0
Duke Treadmill Score: 4
Estimated workload: 6.1
Exercise duration (min): 4 min
Exercise duration (sec): 17 s
MPHR: 157 {beats}/min
Peak HR: 142 {beats}/min
Percent HR: 90 %
Rest HR: 66 {beats}/min
ST Depression (mm): 0 mm

## 2021-06-06 DIAGNOSIS — K5730 Diverticulosis of large intestine without perforation or abscess without bleeding: Secondary | ICD-10-CM | POA: Diagnosis not present

## 2021-06-06 DIAGNOSIS — K648 Other hemorrhoids: Secondary | ICD-10-CM | POA: Diagnosis not present

## 2021-06-06 DIAGNOSIS — Z86010 Personal history of colonic polyps: Secondary | ICD-10-CM | POA: Diagnosis not present

## 2021-06-06 DIAGNOSIS — K635 Polyp of colon: Secondary | ICD-10-CM | POA: Diagnosis not present

## 2021-06-11 DIAGNOSIS — K635 Polyp of colon: Secondary | ICD-10-CM | POA: Diagnosis not present

## 2021-07-22 DIAGNOSIS — I10 Essential (primary) hypertension: Secondary | ICD-10-CM | POA: Diagnosis not present

## 2021-07-22 DIAGNOSIS — E782 Mixed hyperlipidemia: Secondary | ICD-10-CM | POA: Diagnosis not present

## 2021-07-22 DIAGNOSIS — F341 Dysthymic disorder: Secondary | ICD-10-CM | POA: Diagnosis not present

## 2021-07-22 DIAGNOSIS — G8929 Other chronic pain: Secondary | ICD-10-CM | POA: Diagnosis not present

## 2021-08-13 DIAGNOSIS — F319 Bipolar disorder, unspecified: Secondary | ICD-10-CM | POA: Diagnosis not present

## 2021-08-13 DIAGNOSIS — F70 Mild intellectual disabilities: Secondary | ICD-10-CM | POA: Diagnosis not present

## 2021-08-13 DIAGNOSIS — F4310 Post-traumatic stress disorder, unspecified: Secondary | ICD-10-CM | POA: Diagnosis not present

## 2021-08-13 DIAGNOSIS — F6381 Intermittent explosive disorder: Secondary | ICD-10-CM | POA: Diagnosis not present

## 2021-08-16 DIAGNOSIS — R7303 Prediabetes: Secondary | ICD-10-CM | POA: Diagnosis not present

## 2021-08-16 DIAGNOSIS — E782 Mixed hyperlipidemia: Secondary | ICD-10-CM | POA: Diagnosis not present

## 2021-08-16 DIAGNOSIS — F341 Dysthymic disorder: Secondary | ICD-10-CM | POA: Diagnosis not present

## 2021-08-16 DIAGNOSIS — Z0001 Encounter for general adult medical examination with abnormal findings: Secondary | ICD-10-CM | POA: Diagnosis not present

## 2021-08-20 DIAGNOSIS — Z111 Encounter for screening for respiratory tuberculosis: Secondary | ICD-10-CM | POA: Diagnosis not present

## 2021-09-17 DIAGNOSIS — F319 Bipolar disorder, unspecified: Secondary | ICD-10-CM | POA: Diagnosis not present

## 2021-09-17 DIAGNOSIS — F6381 Intermittent explosive disorder: Secondary | ICD-10-CM | POA: Diagnosis not present

## 2021-10-01 DIAGNOSIS — F319 Bipolar disorder, unspecified: Secondary | ICD-10-CM | POA: Diagnosis not present

## 2021-10-01 DIAGNOSIS — F4310 Post-traumatic stress disorder, unspecified: Secondary | ICD-10-CM | POA: Diagnosis not present

## 2021-10-01 DIAGNOSIS — F6381 Intermittent explosive disorder: Secondary | ICD-10-CM | POA: Diagnosis not present

## 2021-10-08 IMAGING — CT CT CHEST LUNG CANCER SCREENING LOW DOSE W/O CM
1 series · 15 of 31 positions shown, 19 images · non-contrast
Comparison: 01/25/2018

CLINICAL DATA: Forty-nine pack-year smoking history. Current
smoker.

EXAM:
CT CHEST WITHOUT CONTRAST LOW-DOSE FOR LUNG CANCER SCREENING
TECHNIQUE: Multidetector CT imaging of the chest was performed following the
standard protocol without IV contrast.

[Series 2: ldct screen -soft · axial · 0.75mm/px · z∈[-318,-28]mm · 15 of 64 slices shown, 19 images]
[im 3/64  mediastinal]
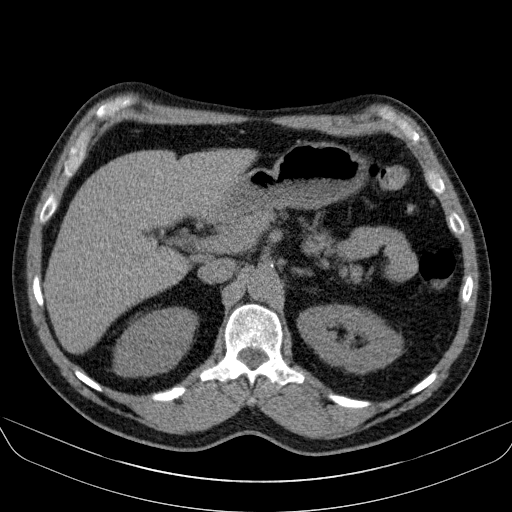
[im 3/64  lung]
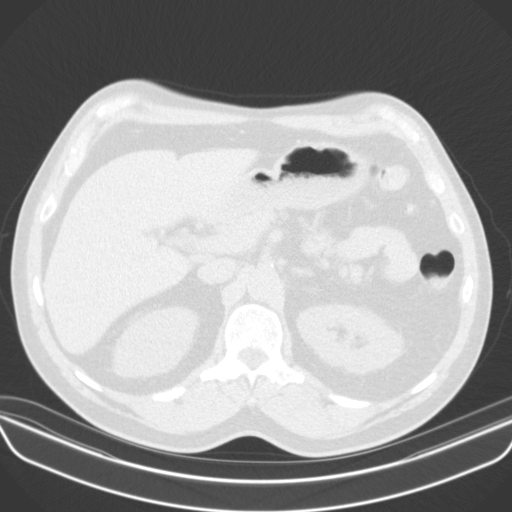
[im 8/64  lung]
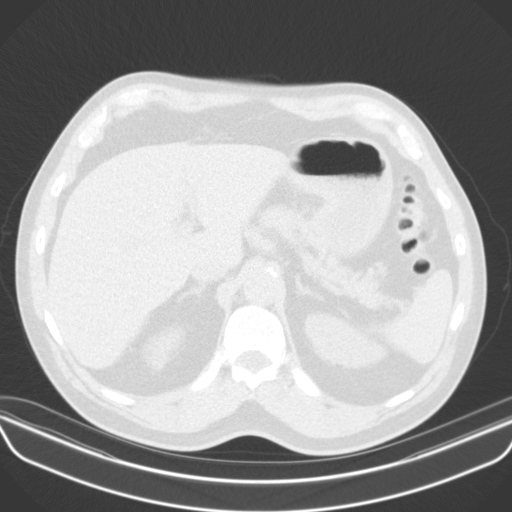
[im 12/64  lung]
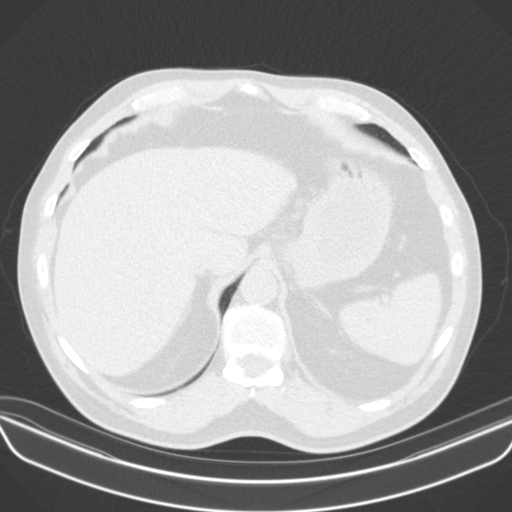
[im 15/64  lung]
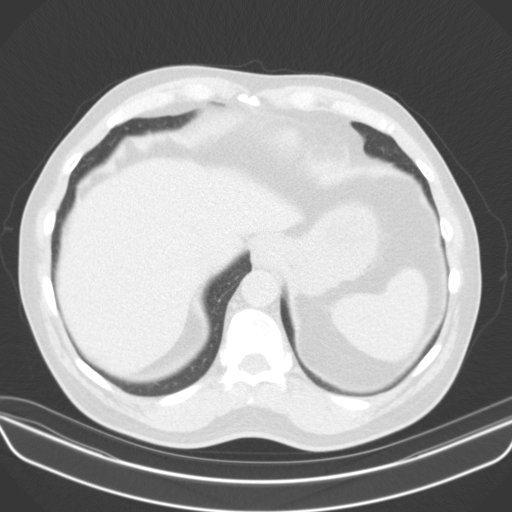
[im 19/64  mediastinal]
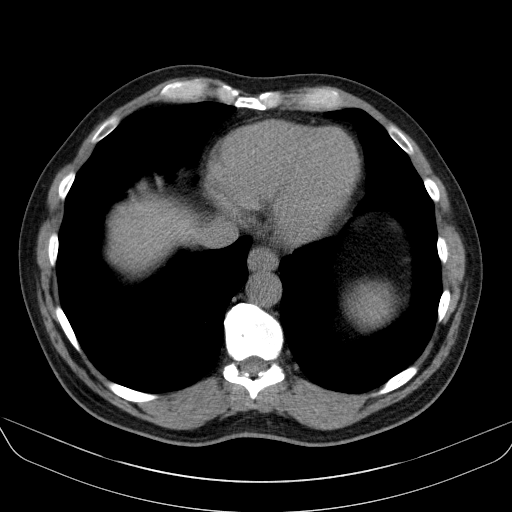
[im 19/64  lung]
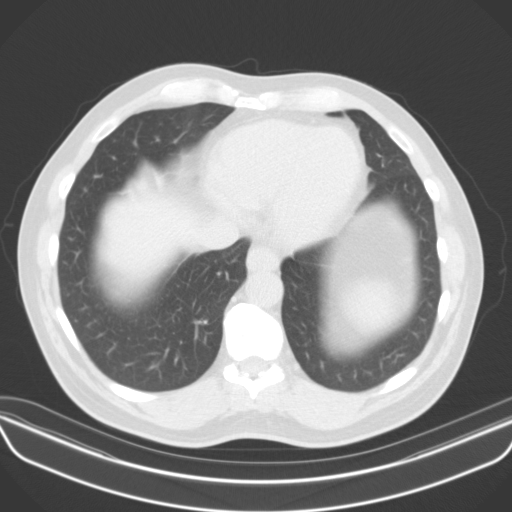
[im 24/64  lung]
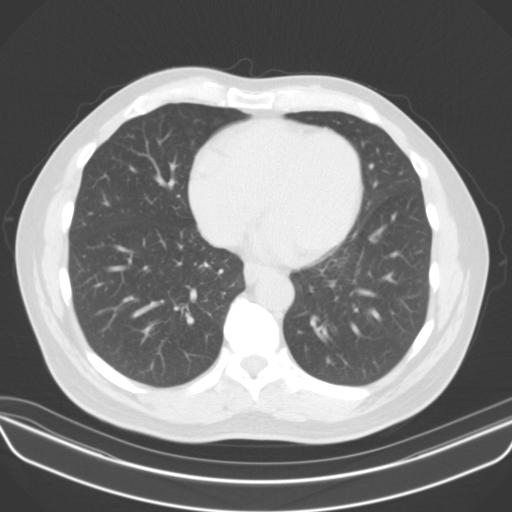
[im 29/64  lung]
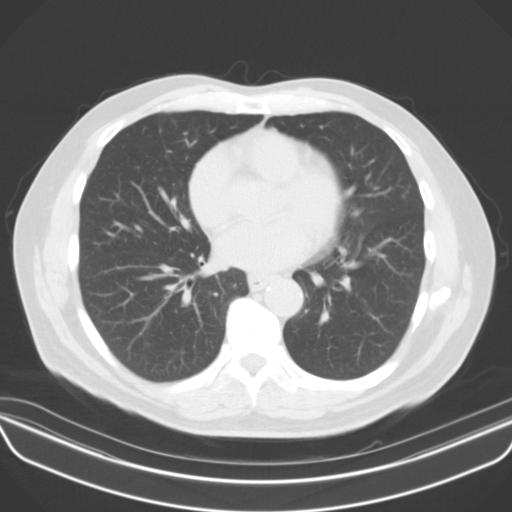
[im 33/64  lung]
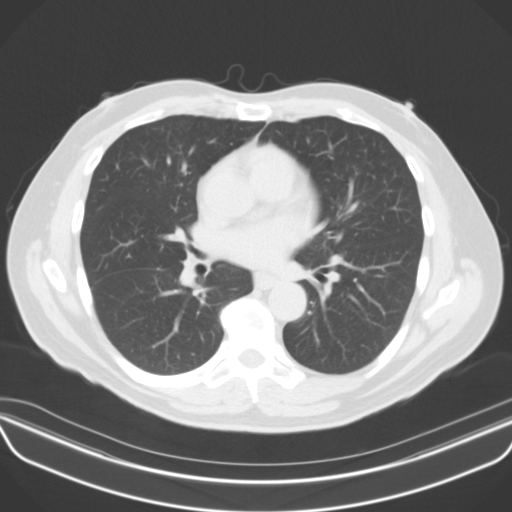
[im 36/64  mediastinal]
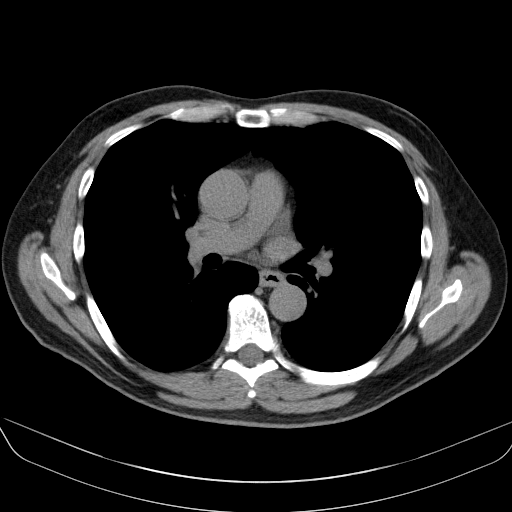
[im 36/64  lung]
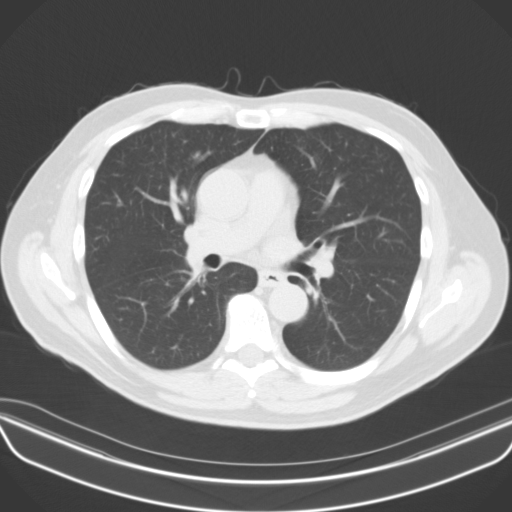
[im 40/64  lung]
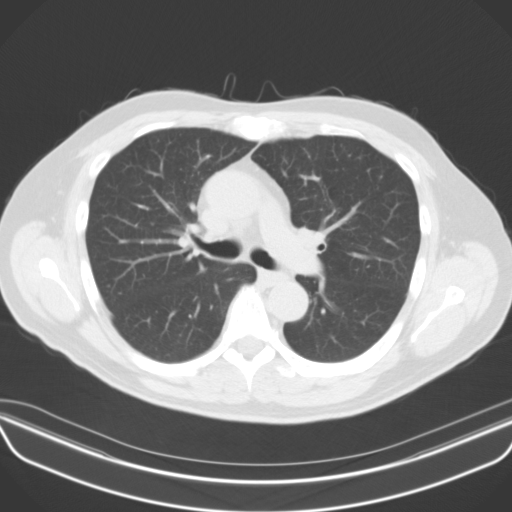
[im 45/64  lung]
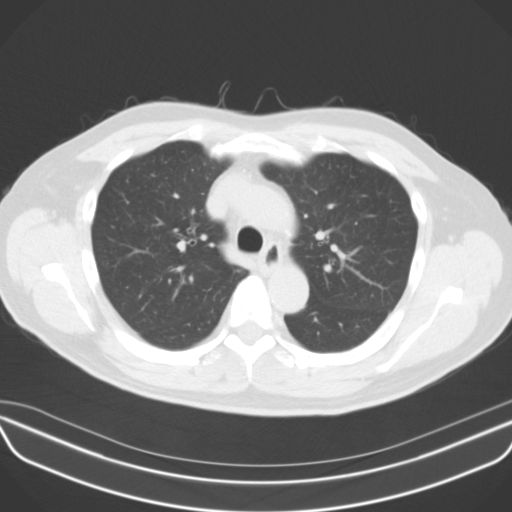
[im 50/64  lung]
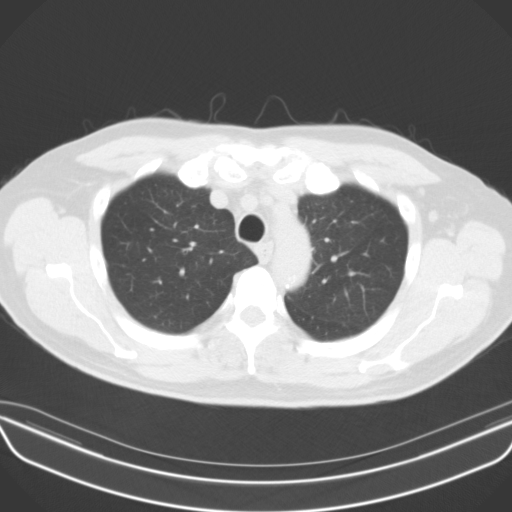
[im 52/64  mediastinal]
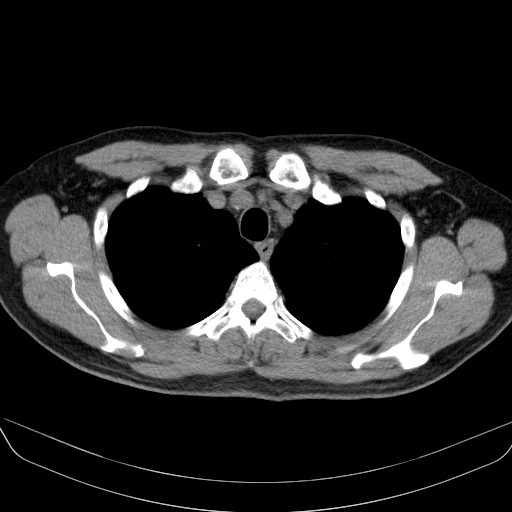
[im 52/64  lung]
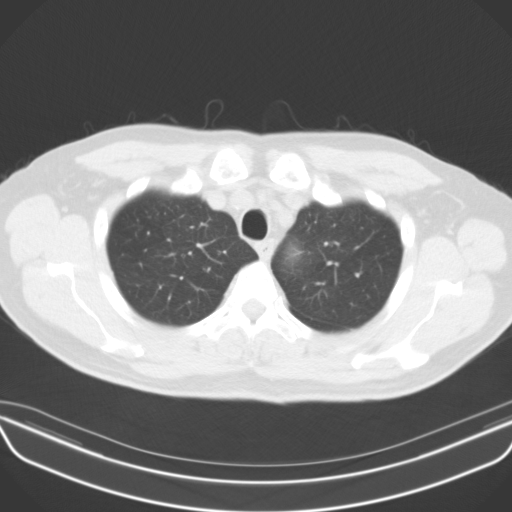
[im 57/64  lung]
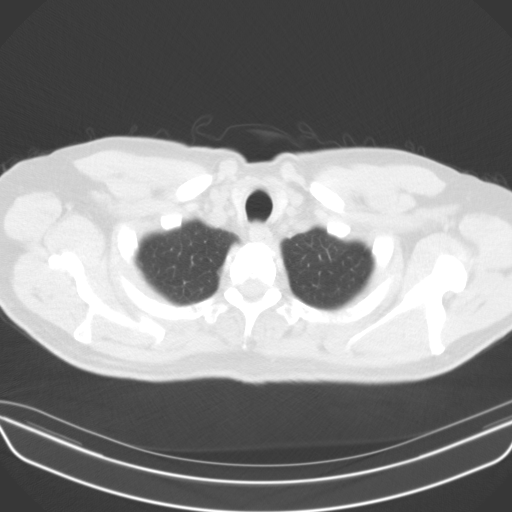
[im 61/64  lung]
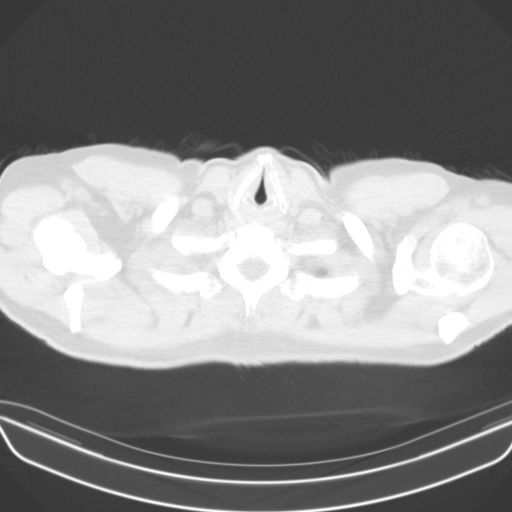

[15 of 31 positions shown; findings below may reference images not displayed]

FINDINGS: Cardiovascular: Aortic atherosclerosis. Normal heart size, without
pericardial effusion. Faint lad coronary artery calcification.

Mediastinum/Nodes: No mediastinal or definite hilar adenopathy,
given limitations of unenhanced CT.

Lungs/Pleura: No pleural fluid. Mild centrilobular emphysema. Right
lower lobe calcified granuloma again identified. Developing soft
tissue density at volume derived equivalent diameter 9.3 mm within
the anterior right chest, favored to be degenerative, related to the
right sternal clavicular joint. Example 80/5.

Upper Abdomen: Normal imaged portions of the liver, spleen, stomach,
pancreas, gallbladder, adrenal glands, kidneys.

Musculoskeletal:  T6 and T7 mild superior endplate wedging, similar.
IMPRESSION: 1. Lung-RADS 3, probably benign findings. Short-term follow-up in 6
months is recommended with repeat low-dose chest CT without contrast
(please use the following order, "CT CHEST LCS NODULE FOLLOW-UP W/O
CM"). Developing soft tissue density along the right
sternoclavicular joint, favored to be degenerative. A pleural-based
pulmonary nodule is felt less likely.
2. Aortic Atherosclerosis (6A4SV-TDF.F) and Emphysema (6A4SV-S4R.A).
Coronary artery atherosclerosis.

## 2021-10-22 DIAGNOSIS — F4310 Post-traumatic stress disorder, unspecified: Secondary | ICD-10-CM | POA: Diagnosis not present

## 2021-10-22 DIAGNOSIS — F319 Bipolar disorder, unspecified: Secondary | ICD-10-CM | POA: Diagnosis not present

## 2021-10-22 DIAGNOSIS — F6381 Intermittent explosive disorder: Secondary | ICD-10-CM | POA: Diagnosis not present

## 2021-11-21 DIAGNOSIS — G8929 Other chronic pain: Secondary | ICD-10-CM | POA: Diagnosis not present

## 2021-11-21 DIAGNOSIS — E782 Mixed hyperlipidemia: Secondary | ICD-10-CM | POA: Diagnosis not present

## 2021-11-21 DIAGNOSIS — I10 Essential (primary) hypertension: Secondary | ICD-10-CM | POA: Diagnosis not present

## 2021-11-21 DIAGNOSIS — R7303 Prediabetes: Secondary | ICD-10-CM | POA: Diagnosis not present

## 2021-12-03 DIAGNOSIS — F6381 Intermittent explosive disorder: Secondary | ICD-10-CM | POA: Diagnosis not present

## 2021-12-03 DIAGNOSIS — F319 Bipolar disorder, unspecified: Secondary | ICD-10-CM | POA: Diagnosis not present

## 2021-12-28 DIAGNOSIS — Z20822 Contact with and (suspected) exposure to covid-19: Secondary | ICD-10-CM | POA: Diagnosis not present

## 2022-01-09 DIAGNOSIS — Z20822 Contact with and (suspected) exposure to covid-19: Secondary | ICD-10-CM | POA: Diagnosis not present

## 2022-01-14 DIAGNOSIS — F319 Bipolar disorder, unspecified: Secondary | ICD-10-CM | POA: Diagnosis not present

## 2022-01-14 DIAGNOSIS — F6381 Intermittent explosive disorder: Secondary | ICD-10-CM | POA: Diagnosis not present

## 2022-01-14 DIAGNOSIS — F4310 Post-traumatic stress disorder, unspecified: Secondary | ICD-10-CM | POA: Diagnosis not present

## 2022-01-21 DIAGNOSIS — Z111 Encounter for screening for respiratory tuberculosis: Secondary | ICD-10-CM | POA: Diagnosis not present

## 2022-01-29 ENCOUNTER — Ambulatory Visit
Admission: RE | Admit: 2022-01-29 | Discharge: 2022-01-29 | Disposition: A | Discharge status: Home / Self Care | Payer: Medicare Other | Source: Ambulatory Visit | Attending: Family Medicine | Admitting: Family Medicine

## 2022-01-29 DIAGNOSIS — J432 Centrilobular emphysema: Secondary | ICD-10-CM | POA: Diagnosis not present

## 2022-01-29 DIAGNOSIS — Z87891 Personal history of nicotine dependence: Secondary | ICD-10-CM

## 2022-01-29 DIAGNOSIS — J9811 Atelectasis: Secondary | ICD-10-CM | POA: Diagnosis not present

## 2022-01-29 DIAGNOSIS — F17210 Nicotine dependence, cigarettes, uncomplicated: Secondary | ICD-10-CM | POA: Diagnosis not present

## 2022-01-29 DIAGNOSIS — I2510 Atherosclerotic heart disease of native coronary artery without angina pectoris: Secondary | ICD-10-CM | POA: Diagnosis not present

## 2022-01-31 ENCOUNTER — Other Ambulatory Visit: Payer: Self-pay

## 2022-01-31 DIAGNOSIS — Z122 Encounter for screening for malignant neoplasm of respiratory organs: Secondary | ICD-10-CM

## 2022-01-31 DIAGNOSIS — F17210 Nicotine dependence, cigarettes, uncomplicated: Secondary | ICD-10-CM

## 2022-01-31 DIAGNOSIS — Z87891 Personal history of nicotine dependence: Secondary | ICD-10-CM

## 2022-02-05 DIAGNOSIS — S6992XA Unspecified injury of left wrist, hand and finger(s), initial encounter: Secondary | ICD-10-CM | POA: Diagnosis not present

## 2022-02-18 DIAGNOSIS — F6381 Intermittent explosive disorder: Secondary | ICD-10-CM | POA: Diagnosis not present

## 2022-02-18 DIAGNOSIS — F319 Bipolar disorder, unspecified: Secondary | ICD-10-CM | POA: Diagnosis not present

## 2022-02-18 DIAGNOSIS — F4310 Post-traumatic stress disorder, unspecified: Secondary | ICD-10-CM | POA: Diagnosis not present

## 2022-02-26 DIAGNOSIS — N401 Enlarged prostate with lower urinary tract symptoms: Secondary | ICD-10-CM | POA: Diagnosis not present

## 2022-02-26 DIAGNOSIS — R350 Frequency of micturition: Secondary | ICD-10-CM | POA: Diagnosis not present

## 2022-03-10 DIAGNOSIS — I700 Atherosclerosis of aorta: Secondary | ICD-10-CM | POA: Diagnosis not present

## 2022-03-10 DIAGNOSIS — I2510 Atherosclerotic heart disease of native coronary artery without angina pectoris: Secondary | ICD-10-CM | POA: Diagnosis not present

## 2022-03-10 DIAGNOSIS — J439 Emphysema, unspecified: Secondary | ICD-10-CM | POA: Diagnosis not present

## 2022-03-10 DIAGNOSIS — Z79899 Other long term (current) drug therapy: Secondary | ICD-10-CM | POA: Diagnosis not present

## 2022-03-10 DIAGNOSIS — R7303 Prediabetes: Secondary | ICD-10-CM | POA: Diagnosis not present

## 2022-04-01 DIAGNOSIS — F319 Bipolar disorder, unspecified: Secondary | ICD-10-CM | POA: Diagnosis not present

## 2022-04-01 DIAGNOSIS — F6381 Intermittent explosive disorder: Secondary | ICD-10-CM | POA: Diagnosis not present

## 2022-06-13 DIAGNOSIS — Z79899 Other long term (current) drug therapy: Secondary | ICD-10-CM | POA: Diagnosis not present

## 2022-06-19 DIAGNOSIS — R7303 Prediabetes: Secondary | ICD-10-CM | POA: Diagnosis not present

## 2022-06-27 DIAGNOSIS — I10 Essential (primary) hypertension: Secondary | ICD-10-CM | POA: Diagnosis not present

## 2022-06-27 DIAGNOSIS — R6884 Jaw pain: Secondary | ICD-10-CM | POA: Diagnosis not present

## 2022-07-08 DIAGNOSIS — F6381 Intermittent explosive disorder: Secondary | ICD-10-CM | POA: Diagnosis not present

## 2022-07-08 DIAGNOSIS — F319 Bipolar disorder, unspecified: Secondary | ICD-10-CM | POA: Diagnosis not present

## 2022-07-08 DIAGNOSIS — F4310 Post-traumatic stress disorder, unspecified: Secondary | ICD-10-CM | POA: Diagnosis not present

## 2022-07-17 DIAGNOSIS — H9202 Otalgia, left ear: Secondary | ICD-10-CM | POA: Diagnosis not present

## 2022-07-17 DIAGNOSIS — I10 Essential (primary) hypertension: Secondary | ICD-10-CM | POA: Diagnosis not present

## 2022-08-06 DIAGNOSIS — E782 Mixed hyperlipidemia: Secondary | ICD-10-CM | POA: Diagnosis not present

## 2022-08-06 DIAGNOSIS — I10 Essential (primary) hypertension: Secondary | ICD-10-CM | POA: Diagnosis not present

## 2022-08-06 DIAGNOSIS — G8929 Other chronic pain: Secondary | ICD-10-CM | POA: Diagnosis not present

## 2022-08-06 DIAGNOSIS — R7303 Prediabetes: Secondary | ICD-10-CM | POA: Diagnosis not present

## 2022-08-11 DIAGNOSIS — Z79899 Other long term (current) drug therapy: Secondary | ICD-10-CM | POA: Diagnosis not present

## 2022-08-12 DIAGNOSIS — F4381 Prolonged grief disorder: Secondary | ICD-10-CM | POA: Diagnosis not present

## 2022-08-12 DIAGNOSIS — F6381 Intermittent explosive disorder: Secondary | ICD-10-CM | POA: Diagnosis not present

## 2022-08-12 DIAGNOSIS — F319 Bipolar disorder, unspecified: Secondary | ICD-10-CM | POA: Diagnosis not present

## 2022-08-27 DIAGNOSIS — R7303 Prediabetes: Secondary | ICD-10-CM | POA: Diagnosis not present

## 2022-08-27 DIAGNOSIS — F341 Dysthymic disorder: Secondary | ICD-10-CM | POA: Diagnosis not present

## 2022-08-27 DIAGNOSIS — I10 Essential (primary) hypertension: Secondary | ICD-10-CM | POA: Diagnosis not present

## 2022-08-27 DIAGNOSIS — Z0001 Encounter for general adult medical examination with abnormal findings: Secondary | ICD-10-CM | POA: Diagnosis not present

## 2022-08-27 DIAGNOSIS — E7800 Pure hypercholesterolemia, unspecified: Secondary | ICD-10-CM | POA: Diagnosis not present

## 2022-08-27 DIAGNOSIS — Z79899 Other long term (current) drug therapy: Secondary | ICD-10-CM | POA: Diagnosis not present

## 2022-09-12 DIAGNOSIS — Z79899 Other long term (current) drug therapy: Secondary | ICD-10-CM | POA: Diagnosis not present

## 2022-09-15 DIAGNOSIS — E871 Hypo-osmolality and hyponatremia: Secondary | ICD-10-CM | POA: Diagnosis not present

## 2022-09-16 DIAGNOSIS — H1045 Other chronic allergic conjunctivitis: Secondary | ICD-10-CM | POA: Diagnosis not present

## 2022-09-16 DIAGNOSIS — H04123 Dry eye syndrome of bilateral lacrimal glands: Secondary | ICD-10-CM | POA: Diagnosis not present

## 2022-09-16 DIAGNOSIS — H2513 Age-related nuclear cataract, bilateral: Secondary | ICD-10-CM | POA: Diagnosis not present

## 2022-09-22 ENCOUNTER — Emergency Department (HOSPITAL_BASED_OUTPATIENT_CLINIC_OR_DEPARTMENT_OTHER): Payer: Medicare Other | Admitting: Radiology

## 2022-09-22 ENCOUNTER — Emergency Department (HOSPITAL_BASED_OUTPATIENT_CLINIC_OR_DEPARTMENT_OTHER)
Admission: EM | Admit: 2022-09-22 | Discharge: 2022-09-22 | Disposition: A | Discharge status: Home / Self Care | Payer: Medicare Other | Attending: Emergency Medicine | Admitting: Emergency Medicine

## 2022-09-22 ENCOUNTER — Other Ambulatory Visit: Payer: Self-pay

## 2022-09-22 DIAGNOSIS — E860 Dehydration: Secondary | ICD-10-CM | POA: Diagnosis not present

## 2022-09-22 DIAGNOSIS — Z1152 Encounter for screening for COVID-19: Secondary | ICD-10-CM | POA: Insufficient documentation

## 2022-09-22 DIAGNOSIS — R062 Wheezing: Secondary | ICD-10-CM | POA: Insufficient documentation

## 2022-09-22 DIAGNOSIS — B349 Viral infection, unspecified: Secondary | ICD-10-CM | POA: Diagnosis not present

## 2022-09-22 DIAGNOSIS — R42 Dizziness and giddiness: Secondary | ICD-10-CM | POA: Diagnosis not present

## 2022-09-22 LAB — RESP PANEL BY RT-PCR (RSV, FLU A&B, COVID)  RVPGX2
Influenza A by PCR: NEGATIVE
Influenza B by PCR: NEGATIVE
Resp Syncytial Virus by PCR: NEGATIVE
SARS Coronavirus 2 by RT PCR: NEGATIVE

## 2022-09-22 LAB — CBC WITH DIFFERENTIAL/PLATELET
Abs Immature Granulocytes: 0.04 10*3/uL (ref 0.00–0.07)
Basophils Absolute: 0 10*3/uL (ref 0.0–0.1)
Basophils Relative: 1 %
Eosinophils Absolute: 0 10*3/uL (ref 0.0–0.5)
Eosinophils Relative: 0 %
HCT: 45 % (ref 39.0–52.0)
Hemoglobin: 15.5 g/dL (ref 13.0–17.0)
Immature Granulocytes: 1 %
Lymphocytes Relative: 15 %
Lymphs Abs: 1.2 10*3/uL (ref 0.7–4.0)
MCH: 31.1 pg (ref 26.0–34.0)
MCHC: 34.4 g/dL (ref 30.0–36.0)
MCV: 90.4 fL (ref 80.0–100.0)
Monocytes Absolute: 1.5 10*3/uL — ABNORMAL HIGH (ref 0.1–1.0)
Monocytes Relative: 18 %
Neutro Abs: 5.5 10*3/uL (ref 1.7–7.7)
Neutrophils Relative %: 65 %
Platelets: 208 10*3/uL (ref 150–400)
RBC: 4.98 MIL/uL (ref 4.22–5.81)
RDW: 11.9 % (ref 11.5–15.5)
WBC: 8.3 10*3/uL (ref 4.0–10.5)
nRBC: 0 % (ref 0.0–0.2)

## 2022-09-22 LAB — BASIC METABOLIC PANEL
Anion gap: 10 (ref 5–15)
BUN: 19 mg/dL (ref 8–23)
CO2: 28 mmol/L (ref 22–32)
Calcium: 10.3 mg/dL (ref 8.9–10.3)
Chloride: 93 mmol/L — ABNORMAL LOW (ref 98–111)
Creatinine, Ser: 1.24 mg/dL (ref 0.61–1.24)
GFR, Estimated: >60 mL/min (ref >60)
Glucose, Bld: 116 mg/dL — ABNORMAL HIGH (ref 70–99)
Potassium: 3.9 mmol/L (ref 3.5–5.1)
Sodium: 131 mmol/L — ABNORMAL LOW (ref 135–145)

## 2022-09-22 MED ORDER — SODIUM CHLORIDE 0.9 % IV BOLUS
1000.0000 mL | Freq: Once | INTRAVENOUS | Status: AC
  Administered 2022-09-22: 1000 mL via INTRAVENOUS

## 2022-09-22 MED ORDER — ALBUTEROL SULFATE HFA 108 (90 BASE) MCG/ACT IN AERS
2.0000 | INHALATION_SPRAY | Freq: Once | RESPIRATORY_TRACT | Status: AC
  Administered 2022-09-22: 2 via RESPIRATORY_TRACT
  Filled 2022-09-22: qty 6.7

## 2022-09-22 MED ORDER — ACETAMINOPHEN 325 MG PO TABS
650.0000 mg | ORAL_TABLET | Freq: Once | ORAL | Status: AC
  Administered 2022-09-22: 650 mg via ORAL
  Filled 2022-09-22: qty 2
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
## 2022-09-22 NOTE — Discharge Instructions (Addendum)
You came to the emergency department today due to potential dehydration. Your sodium is slightly low however this is likely normal now that you have gotten IV fluids.  You did have a fever so you were given Tylenol.  Take Tylenol and Motrin in alternation at home.  We suspect that you have a viral illness similar to COVID or the flu that we are unable to detect on the swab.  This can be treated with over-the-counter medications however do not hesitate to return to the emergency department, especially with fevers that will not come down with medications, shortness of breath or chest pain.  Also, please return if you have any loss of consciousness.  It was a pleasure to meet you and we hope you feel better!  Enjoy your [place]!

## 2022-09-22 NOTE — ED Triage Notes (Signed)
Pt via pov from home ([facility]) with sister, who is his guardian. She reports that EMS came to group home because he was staggering. He had high sodium and was given diuretic last week. He went to pcp on Monday and his labwork was normal. Pt began staggering when he awakened and stood up. Pt alert & oriented, nad noted.

## 2022-09-22 NOTE — ED Notes (Signed)
Pt took scheduled klonipin 1 mg in triage because it was due at 1600.

## 2022-09-22 NOTE — ED Notes (Signed)
Discharge instructions, s/s management, and follow up care reviewed and explained with pt and his caregiver who verbalized understanding and had no further questions on d/c. Pt caox4 and ambulatory on d/c.

## 2022-09-24 DIAGNOSIS — E871 Hypo-osmolality and hyponatremia: Secondary | ICD-10-CM | POA: Diagnosis not present

## 2022-09-26 DIAGNOSIS — R350 Frequency of micturition: Secondary | ICD-10-CM | POA: Diagnosis not present

## 2022-10-03 DIAGNOSIS — E871 Hypo-osmolality and hyponatremia: Secondary | ICD-10-CM | POA: Diagnosis not present

## 2022-11-18 DIAGNOSIS — F3131 Bipolar disorder, current episode depressed, mild: Secondary | ICD-10-CM | POA: Diagnosis not present

## 2022-11-26 DIAGNOSIS — E871 Hypo-osmolality and hyponatremia: Secondary | ICD-10-CM | POA: Diagnosis not present

## 2022-11-26 DIAGNOSIS — I10 Essential (primary) hypertension: Secondary | ICD-10-CM | POA: Diagnosis not present

## 2023-01-06 ENCOUNTER — Other Ambulatory Visit: Payer: Self-pay | Admitting: Acute Care

## 2023-01-06 DIAGNOSIS — Z122 Encounter for screening for malignant neoplasm of respiratory organs: Secondary | ICD-10-CM

## 2023-01-06 DIAGNOSIS — F17210 Nicotine dependence, cigarettes, uncomplicated: Secondary | ICD-10-CM

## 2023-01-06 DIAGNOSIS — Z87891 Personal history of nicotine dependence: Secondary | ICD-10-CM

## 2023-02-03 DIAGNOSIS — F329 Major depressive disorder, single episode, unspecified: Secondary | ICD-10-CM | POA: Diagnosis not present

## 2023-02-05 ENCOUNTER — Other Ambulatory Visit: Payer: Medicaid Other

## 2023-02-17 ENCOUNTER — Ambulatory Visit
Admission: RE | Admit: 2023-02-17 | Discharge: 2023-02-17 | Disposition: A | Discharge status: Home / Self Care | Payer: Medicare Other | Source: Ambulatory Visit | Attending: Acute Care | Admitting: Acute Care

## 2023-02-17 DIAGNOSIS — Z122 Encounter for screening for malignant neoplasm of respiratory organs: Secondary | ICD-10-CM

## 2023-02-17 DIAGNOSIS — Z87891 Personal history of nicotine dependence: Secondary | ICD-10-CM

## 2023-02-17 DIAGNOSIS — F17210 Nicotine dependence, cigarettes, uncomplicated: Secondary | ICD-10-CM

## 2023-02-23 ENCOUNTER — Other Ambulatory Visit: Payer: Self-pay | Admitting: Acute Care

## 2023-02-23 DIAGNOSIS — Z87891 Personal history of nicotine dependence: Secondary | ICD-10-CM

## 2023-02-23 DIAGNOSIS — F17210 Nicotine dependence, cigarettes, uncomplicated: Secondary | ICD-10-CM

## 2023-02-23 DIAGNOSIS — Z122 Encounter for screening for malignant neoplasm of respiratory organs: Secondary | ICD-10-CM

## 2023-02-25 DIAGNOSIS — I10 Essential (primary) hypertension: Secondary | ICD-10-CM | POA: Diagnosis not present

## 2023-02-25 DIAGNOSIS — L600 Ingrowing nail: Secondary | ICD-10-CM | POA: Diagnosis not present

## 2023-03-03 DIAGNOSIS — F4310 Post-traumatic stress disorder, unspecified: Secondary | ICD-10-CM | POA: Diagnosis not present

## 2023-03-03 DIAGNOSIS — F6381 Intermittent explosive disorder: Secondary | ICD-10-CM | POA: Diagnosis not present

## 2023-03-03 DIAGNOSIS — F319 Bipolar disorder, unspecified: Secondary | ICD-10-CM | POA: Diagnosis not present

## 2023-03-19 DIAGNOSIS — N401 Enlarged prostate with lower urinary tract symptoms: Secondary | ICD-10-CM | POA: Diagnosis not present

## 2023-03-19 DIAGNOSIS — I10 Essential (primary) hypertension: Secondary | ICD-10-CM | POA: Diagnosis not present

## 2023-03-19 DIAGNOSIS — N138 Other obstructive and reflux uropathy: Secondary | ICD-10-CM | POA: Diagnosis not present

## 2023-03-31 DIAGNOSIS — F4310 Post-traumatic stress disorder, unspecified: Secondary | ICD-10-CM | POA: Diagnosis not present

## 2023-03-31 DIAGNOSIS — F6381 Intermittent explosive disorder: Secondary | ICD-10-CM | POA: Diagnosis not present

## 2023-03-31 DIAGNOSIS — F319 Bipolar disorder, unspecified: Secondary | ICD-10-CM | POA: Diagnosis not present

## 2023-04-07 DIAGNOSIS — F329 Major depressive disorder, single episode, unspecified: Secondary | ICD-10-CM | POA: Diagnosis not present

## 2023-04-08 DIAGNOSIS — L600 Ingrowing nail: Secondary | ICD-10-CM | POA: Diagnosis not present

## 2023-04-08 DIAGNOSIS — M79674 Pain in right toe(s): Secondary | ICD-10-CM | POA: Diagnosis not present

## 2023-04-08 DIAGNOSIS — M79675 Pain in left toe(s): Secondary | ICD-10-CM | POA: Diagnosis not present

## 2023-04-19 DIAGNOSIS — Z122 Encounter for screening for malignant neoplasm of respiratory organs: Secondary | ICD-10-CM | POA: Diagnosis not present

## 2023-04-19 DIAGNOSIS — F17210 Nicotine dependence, cigarettes, uncomplicated: Secondary | ICD-10-CM | POA: Diagnosis not present

## 2023-05-01 DIAGNOSIS — L600 Ingrowing nail: Secondary | ICD-10-CM | POA: Diagnosis not present

## 2023-05-14 DIAGNOSIS — F319 Bipolar disorder, unspecified: Secondary | ICD-10-CM | POA: Diagnosis not present

## 2023-06-18 DIAGNOSIS — F319 Bipolar disorder, unspecified: Secondary | ICD-10-CM | POA: Diagnosis not present

## 2023-06-22 DIAGNOSIS — J029 Acute pharyngitis, unspecified: Secondary | ICD-10-CM | POA: Diagnosis not present

## 2023-06-22 DIAGNOSIS — Z20822 Contact with and (suspected) exposure to covid-19: Secondary | ICD-10-CM | POA: Diagnosis not present

## 2023-06-30 DIAGNOSIS — F319 Bipolar disorder, unspecified: Secondary | ICD-10-CM | POA: Diagnosis not present

## 2023-07-03 DIAGNOSIS — F319 Bipolar disorder, unspecified: Secondary | ICD-10-CM | POA: Diagnosis not present

## 2023-07-24 DIAGNOSIS — F319 Bipolar disorder, unspecified: Secondary | ICD-10-CM | POA: Diagnosis not present

## 2023-07-30 DIAGNOSIS — Z20822 Contact with and (suspected) exposure to covid-19: Secondary | ICD-10-CM | POA: Diagnosis not present

## 2023-07-30 DIAGNOSIS — R0981 Nasal congestion: Secondary | ICD-10-CM | POA: Diagnosis not present

## 2023-07-30 DIAGNOSIS — R059 Cough, unspecified: Secondary | ICD-10-CM | POA: Diagnosis not present

## 2023-07-30 DIAGNOSIS — J029 Acute pharyngitis, unspecified: Secondary | ICD-10-CM | POA: Diagnosis not present

## 2023-08-10 DIAGNOSIS — F319 Bipolar disorder, unspecified: Secondary | ICD-10-CM | POA: Diagnosis not present

## 2023-08-14 DIAGNOSIS — F439 Reaction to severe stress, unspecified: Secondary | ICD-10-CM | POA: Diagnosis not present

## 2023-08-14 DIAGNOSIS — F319 Bipolar disorder, unspecified: Secondary | ICD-10-CM | POA: Diagnosis not present

## 2023-09-10 DIAGNOSIS — E7800 Pure hypercholesterolemia, unspecified: Secondary | ICD-10-CM | POA: Diagnosis not present

## 2023-09-10 DIAGNOSIS — E871 Hypo-osmolality and hyponatremia: Secondary | ICD-10-CM | POA: Diagnosis not present

## 2023-09-10 DIAGNOSIS — Z79899 Other long term (current) drug therapy: Secondary | ICD-10-CM | POA: Diagnosis not present

## 2023-09-10 DIAGNOSIS — F17210 Nicotine dependence, cigarettes, uncomplicated: Secondary | ICD-10-CM | POA: Diagnosis not present

## 2023-09-10 DIAGNOSIS — Z125 Encounter for screening for malignant neoplasm of prostate: Secondary | ICD-10-CM | POA: Diagnosis not present

## 2023-09-10 DIAGNOSIS — R7303 Prediabetes: Secondary | ICD-10-CM | POA: Diagnosis not present

## 2023-09-10 DIAGNOSIS — I10 Essential (primary) hypertension: Secondary | ICD-10-CM | POA: Diagnosis not present

## 2023-09-10 DIAGNOSIS — Z0001 Encounter for general adult medical examination with abnormal findings: Secondary | ICD-10-CM | POA: Diagnosis not present

## 2023-09-25 DIAGNOSIS — E871 Hypo-osmolality and hyponatremia: Secondary | ICD-10-CM | POA: Diagnosis not present

## 2023-09-25 DIAGNOSIS — Z23 Encounter for immunization: Secondary | ICD-10-CM | POA: Diagnosis not present

## 2023-10-02 DIAGNOSIS — F319 Bipolar disorder, unspecified: Secondary | ICD-10-CM | POA: Diagnosis not present

## 2023-10-05 DIAGNOSIS — E871 Hypo-osmolality and hyponatremia: Secondary | ICD-10-CM | POA: Diagnosis not present

## 2023-10-05 DIAGNOSIS — I10 Essential (primary) hypertension: Secondary | ICD-10-CM | POA: Diagnosis not present

## 2023-10-21 DIAGNOSIS — F319 Bipolar disorder, unspecified: Secondary | ICD-10-CM | POA: Diagnosis not present

## 2023-11-24 DIAGNOSIS — F319 Bipolar disorder, unspecified: Secondary | ICD-10-CM | POA: Diagnosis not present

## 2024-01-06 DIAGNOSIS — M25512 Pain in left shoulder: Secondary | ICD-10-CM | POA: Diagnosis not present

## 2024-01-07 DIAGNOSIS — M25512 Pain in left shoulder: Secondary | ICD-10-CM | POA: Diagnosis not present

## 2024-02-17 DIAGNOSIS — Z720 Tobacco use: Secondary | ICD-10-CM | POA: Diagnosis not present

## 2024-02-17 DIAGNOSIS — R0981 Nasal congestion: Secondary | ICD-10-CM | POA: Diagnosis not present

## 2024-02-17 DIAGNOSIS — B338 Other specified viral diseases: Secondary | ICD-10-CM | POA: Diagnosis not present

## 2024-02-17 DIAGNOSIS — R051 Acute cough: Secondary | ICD-10-CM | POA: Diagnosis not present

## 2024-02-17 DIAGNOSIS — Z20828 Contact with and (suspected) exposure to other viral communicable diseases: Secondary | ICD-10-CM | POA: Diagnosis not present

## 2024-02-29 DIAGNOSIS — R051 Acute cough: Secondary | ICD-10-CM | POA: Diagnosis not present

## 2024-02-29 DIAGNOSIS — J208 Acute bronchitis due to other specified organisms: Secondary | ICD-10-CM | POA: Diagnosis not present

## 2024-02-29 DIAGNOSIS — B9689 Other specified bacterial agents as the cause of diseases classified elsewhere: Secondary | ICD-10-CM | POA: Diagnosis not present

## 2024-03-10 DIAGNOSIS — J189 Pneumonia, unspecified organism: Secondary | ICD-10-CM | POA: Diagnosis not present

## 2024-03-15 DIAGNOSIS — F319 Bipolar disorder, unspecified: Secondary | ICD-10-CM | POA: Diagnosis not present

## 2024-04-04 ENCOUNTER — Other Ambulatory Visit: Payer: Self-pay | Admitting: Family Medicine

## 2024-04-04 DIAGNOSIS — J984 Other disorders of lung: Secondary | ICD-10-CM

## 2024-04-08 ENCOUNTER — Ambulatory Visit
Admission: RE | Admit: 2024-04-08 | Discharge: 2024-04-08 | Disposition: A | Discharge status: Home / Self Care | Source: Ambulatory Visit | Attending: Family Medicine | Admitting: Family Medicine

## 2024-04-08 DIAGNOSIS — J984 Other disorders of lung: Secondary | ICD-10-CM

## 2024-04-08 DIAGNOSIS — R918 Other nonspecific abnormal finding of lung field: Secondary | ICD-10-CM | POA: Diagnosis not present

## 2024-05-10 DIAGNOSIS — R0989 Other specified symptoms and signs involving the circulatory and respiratory systems: Secondary | ICD-10-CM | POA: Diagnosis not present

## 2024-05-10 DIAGNOSIS — Z03818 Encounter for observation for suspected exposure to other biological agents ruled out: Secondary | ICD-10-CM | POA: Diagnosis not present
